# Patient Record
Sex: Male | Born: 1935 | Hispanic: Yes | Marital: Married | Smoking: Current some day smoker
Health system: Southern US, Community
[De-identification: ages and names within clinical notes are randomized; demographics above are authoritative.]

## PROBLEM LIST (undated history)

## (undated) DIAGNOSIS — I10 Essential (primary) hypertension: Secondary | ICD-10-CM

## (undated) DIAGNOSIS — E119 Type 2 diabetes mellitus without complications: Secondary | ICD-10-CM

## (undated) HISTORY — PX: EYE SURGERY: SHX253

---

## 2018-02-03 ENCOUNTER — Ambulatory Visit: Payer: Self-pay | Admitting: Podiatry

## 2018-04-12 ENCOUNTER — Encounter: Payer: Self-pay | Admitting: Physical Therapy

## 2018-04-12 ENCOUNTER — Other Ambulatory Visit: Payer: Self-pay

## 2018-04-12 ENCOUNTER — Ambulatory Visit: Payer: Medicare Other | Attending: Family Medicine | Admitting: Physical Therapy

## 2018-04-12 DIAGNOSIS — R2681 Unsteadiness on feet: Secondary | ICD-10-CM | POA: Insufficient documentation

## 2018-04-12 DIAGNOSIS — R2689 Other abnormalities of gait and mobility: Secondary | ICD-10-CM

## 2018-04-12 NOTE — Addendum Note (Signed)
Addended by: Encarnacion ChuABASHIAN, ASHLEY D on: 04/12/2018 12:37 PM   Modules accepted: Orders

## 2018-04-12 NOTE — Therapy (Signed)
[facility] MAIN [facility] [address] [place], [place], [address] Phone: [phone]   Fax:  [phone]  Physical Therapy Evaluation  Patient Details  Name: Jose Day MRN: [id number] Date of Birth: 20-Feb-1928 Referring Provider: Gladys DammeAdrain Alexander Manchero Revelo, MD   Encounter Date: 04/12/2018  PT End of Session - 04/12/18 0909    Visit Number  1    Number of Visits  13    Date for PT Re-Evaluation  05/24/18    Authorization Type  1/10 progress note    PT Start Time  0804    PT Stop Time  0912    PT Time Calculation (min)  68 min    Equipment Utilized During Treatment  Gait belt    Activity Tolerance  Patient tolerated treatment well    Behavior During Therapy  [facility] for tasks assessed/performed       History reviewed. No pertinent past medical history.  History reviewed. No pertinent surgical history.  There were no vitals filed for this visit.   Subjective Assessment - 04/12/18 1022    Subjective  Pt presents with imbalance and R knee pain    Patient is accompained by:  Family member;Interpreter Interpreter: Alis.  Daughter and wife.     Pertinent History  Pt is a 84 y/o M who reports he is having R knee pain and balance issues.  About 15-20 years ago he was playing baseball and he fell, injuring his R knee.  The pt had surgery but family unable to reports what type of surgery.  Pt denies any additional surgery either LE.  Pt walks ~15 minutes often with his wife and has been complaining about knee pain toward the end of his walk.  Pt does not use AD and denies any falls in the past 6 months.  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  Pt reports swelling in Bil feet and ankles with MD changing his medication for this a few months ago.  Pt reports some numbness in Bil feet. Pt reports he moves slowly to avoid falling.  Goals: to walk  better and farther. Current pain: 0/10, worst pain: 5/10 in R knee. Pt with memory impairments and is Spanish speaking, making history taking very challenging.  Family says MD is not aware of pt's memory issues (began 2-3 years ago), this PT encouraged family and pt to share this with MD at next visit. Pt's wife reports pt has had a decreased appetite (sometimes doesn't feel hungry) for a few months which has resulted in 2-3 lb weight loss.  Pt has been experiencing some constipation for up to 1-2 days at a time for about 1 year.  Doctor is unaware of the constipation.  Pt sleeps a lot and seems more tired which began ~2-3 months ago. Pt denies feeling down.       Limitations  Walking    How long can you sit comfortably?  n/a    How long can you walk comfortably?  15 minutes    Patient Stated Goals  to be able to walk farther and improve balance    Currently in Pain?  No/denies         [facility] PT Assessment - 04/12/18 0824      Assessment   Medical Diagnosis  Balance BLE Edema    Referring Provider  Adrain Dorothea OgleAlexander Manchero Revelo, MD    Onset Date/Surgical Date  04/13/15    Next MD Visit  In July or August 2019    Prior Therapy  No      Precautions   Precautions  Fall      Restrictions   Weight Bearing Restrictions  No      Balance Screen   Has the patient fallen in the past 6 months  No    Has the patient had a decrease in activity level because of a fear of falling?   No    Is the patient reluctant to leave their home because of a fear of falling?   No      Home Environment   Living Environment  Private residence    Living Arrangements  Spouse/significant other    Available Help at Discharge  Family;Available 24 hours/day    Type of Home  Apartment    Home Access  Stairs to enter    Entrance Stairs-Number of Steps  3    Entrance Stairs-Rails  Left;Right;Cannot reach both    Home Layout  One level    Home Equipment  None      Prior Function   Level of Independence   Independent with household mobility without device    Vocation  Retired    Leisure  Listen to music, to dance to the music, walking with wife, reading      Cognition   Overall Cognitive Status  Difficult to assess    Difficult to assess due to  Non-English speaking    Memory  Impaired family reports some memory impairments      Balance   Balance Assessed  Yes      Standardized Balance Assessment   Standardized Balance Assessment  Berg Balance Test      Berg Balance Test   Sit to Stand  Able to stand  independently using hands    Standing Unsupported  Able to stand safely 2 minutes    Sitting with Back Unsupported but Feet Supported on Floor or Stool  Able to sit safely and securely 2 minutes    Stand to Sit  Sits safely with minimal use of hands    Transfers  Able to transfer safely, minor use of hands    Standing Unsupported with Eyes Closed  Able to stand 10 seconds with supervision    Standing Ubsupported with Feet Together  Able to place feet together independently and stand 1 minute safely    From Standing, Reach Forward with Outstretched Arm  Can reach forward >12 cm safely (5")    From Standing Position, Pick up Object from Floor  Able to pick up shoe, needs supervision    From Standing Position, Turn to Look Behind Over each Shoulder  Looks behind from both sides and weight shifts well    Turn 360 Degrees  Able to turn 360 degrees safely but slowly    Standing Unsupported, Alternately Place Feet on Step/Stool  Able to stand independently and complete 8 steps >20 seconds    Standing Unsupported, One Foot in [employer] balance while stepping or standing    Standing on One Leg  Unable to try or needs assist to prevent fall    Total Score  41       EXAMINATION   5xSTS: 15.91 seconds   Berg Balance Test: 41/56   : 1.17 m/s   TUG: 16.47 seconds   ABC Scale: 61.25%   Sensation: Decreased sensation to light touch distal to Bil ankles, decreased discrimination to  dull/sharp Bil ankles and feet (L more impaired than R)   Reflexes: pt unable to relax sufficiently for proper testing despite use of Jendrassik maneuver   Posture in sitting: rounded shoulders, thoracic kyphosis, forward head posture, sacral sitting   Gait Analysis: Dec Bil hip E at terminal stance, flexed posture, dec Bil hip F   Observation: swelling Bil ankles (L>R), 1+ pitting, MD recently changed the medication due to swelling (changed in March)           Objective measurements completed on examination: See above findings.              PT Education - 04/12/18 0908    Education Details  role of PT; POC    Person(s) Educated  Patient    Methods  Explanation interpreter: Alis    Comprehension  Verbalized understanding       PT Short Term Goals - 04/12/18 0909      PT SHORT TERM GOAL #1   Title  Pt will independently complete HEP at least 4 days/wk for carryover between sessions    Time  2    Period  Weeks    Status  New        PT Long Term Goals - 04/12/18 0910      PT LONG TERM GOAL #1   Title  Pt will improve TUG time to less than or equal to 12 seconds to demonstrate improve balance and speed    Baseline  16.47 seconds    Time  6    Period  Weeks    Status  New      PT LONG TERM GOAL #2   Title  Pt's Berg Balance Test score will improve to at least 50/56 to demonstrate improved balance    Baseline  41/56    Time  6    Period  Weeks    Status  New      PT LONG TERM GOAL #3   Title  Pt's ABC Scale will improve to at least 80% to demonstrate improved perception of balance    Baseline  61.25%    Time  5    Period  Weeks    Status  New      PT LONG TERM GOAL #4   Title  Pt will be able to ambulate at least 25 minutes to be able to walk farther with his wife for exercise    Baseline  15 minutes    Time  6    Period  Weeks    Status  New             Plan - 04/12/18 1226    Clinical Impression Statement  Pt is a 84 y/o F who  presents with impaired balance with likely contributors of reports of chronic R knee pain and Bil ankle and foot swelling.  Pt's Berg Balance Test indicated pt is at an increased risk of falling and pt ambulates with a guarded posture.  Pt's ABC scale indicates that the pt has a perception of impaired balance.  Pt is limited to ambulating 15 minutes but enjoys walking with his wife and would like to go for longer walks with improved balance.  Pt will benefit from skilled PT interventions for improved balance and QOL.      History and Personal Factors relevant to plan of care:  (+) Pt very active and would like to improve his level of activity, very supportive family  (-) Chronicity of  R knee pain, Bil ankle and foot swelling, h/o diabetes with impaired sensation    Clinical Presentation  Stable    Clinical Presentation due to:  Pt's presentation is stable and predictable based on comorbities with no significant changes in presentation since onset    Clinical Decision Making  Low    Rehab Potential  Good    PT Frequency  2x / week    PT Duration  6 weeks    PT Treatment/Interventions  ADLs/Self Care Home Management;Aquatic Therapy;Biofeedback;Cryotherapy;Electrical Stimulation;Iontophoresis 4mg /ml Dexamethasone;Moist Heat;Traction;Ultrasound;Contrast Bath;DME Instruction;Gait training;Stair training;Functional mobility training;Therapeutic activities;Therapeutic exercise;Balance training;Neuromuscular re-education;Cognitive remediation;Patient/family education;Orthotic Fit/Training;Manual techniques;Compression bandaging;Passive range of motion;Dry needling;Energy conservation;Taping    PT Next Visit Plan  check semmes-weinstein, screen R knee, initiate HEP with balance and strengthening interventions    PT Home Exercise Plan  to initiate next session    Recommended Other Services  none at this time (remain mindful of depression like symptoms relayed from family, pt denies)    Consulted and Agree with  Plan of Care  Patient;Family member/caregiver    Family Member Consulted  wife and daughter       Patient will benefit from skilled therapeutic intervention in order to improve the following deficits and impairments:  Abnormal gait, Decreased balance, Decreased cognition, Decreased endurance, Decreased knowledge of use of DME, Decreased safety awareness, Decreased strength, Difficulty walking, Increased edema, Increased fascial restricitons, Increased muscle spasms, Impaired perceived functional ability, Impaired flexibility, Impaired sensation, Improper body mechanics, Postural dysfunction, Pain  Visit Diagnosis: Unsteadiness on feet  Other abnormalities of gait and mobility     Problem List There are no active problems to display for this patient.   Encarnacion Chu PT, DPT 04/12/2018, 12:33 PM  [place] [facility] MAIN [facility] SERVICES [address] [place], [place], [address] Phone: [phone]   Fax:  [phone]  Name: Jose Day MRN: [id number] Date of Birth: [birth year]

## 2018-04-19 ENCOUNTER — Ambulatory Visit: Payer: Self-pay

## 2018-04-21 ENCOUNTER — Ambulatory Visit: Payer: Medicare Other | Admitting: Physical Therapy

## 2018-04-26 ENCOUNTER — Ambulatory Visit: Payer: Self-pay

## 2018-04-27 ENCOUNTER — Ambulatory Visit: Payer: Medicare Other | Admitting: Physical Therapy

## 2018-04-29 ENCOUNTER — Ambulatory Visit: Payer: Medicare Other | Admitting: Physical Therapy

## 2018-05-04 ENCOUNTER — Ambulatory Visit: Payer: Self-pay | Admitting: Physical Therapy

## 2018-05-06 ENCOUNTER — Ambulatory Visit: Payer: Medicare Other | Admitting: Physical Therapy

## 2018-05-06 ENCOUNTER — Ambulatory Visit: Payer: Self-pay | Admitting: Physical Therapy

## 2018-05-11 ENCOUNTER — Ambulatory Visit: Payer: Self-pay | Admitting: Physical Therapy

## 2018-05-12 ENCOUNTER — Ambulatory Visit: Payer: Medicare Other | Admitting: Physical Therapy

## 2018-05-17 ENCOUNTER — Ambulatory Visit: Payer: Medicare Other | Admitting: Physical Therapy

## 2018-05-19 ENCOUNTER — Ambulatory Visit: Payer: Medicare Other | Admitting: Physical Therapy

## 2018-05-20 ENCOUNTER — Encounter: Payer: Self-pay | Admitting: Physical Therapy

## 2018-05-20 ENCOUNTER — Ambulatory Visit: Payer: Medicare Other | Attending: Family Medicine

## 2018-05-20 VITALS — BP 172/88 | HR 78

## 2018-05-20 DIAGNOSIS — R2681 Unsteadiness on feet: Secondary | ICD-10-CM | POA: Diagnosis not present

## 2018-05-20 DIAGNOSIS — R2689 Other abnormalities of gait and mobility: Secondary | ICD-10-CM | POA: Diagnosis present

## 2018-05-20 NOTE — Therapy (Signed)
[place] MAIN [facility] SERVICES [address] [place], [place], [address] Phone: [phone]   Fax:  [phone]  Physical Therapy Treatment  Patient Details  Name: Jose [name] MRN: [id number] Date of Birth: 1928-03-26 Referring Provider: Michele Rockers, MD   Encounter Date: 05/20/2018  PT End of Session - 05/24/18 1353    Visit Number  2    Number of Visits  29    Date for PT Re-Evaluation  07/15/18    Authorization Type  2/10 progress note, next visit is 1/10    PT Start Time  1515    PT Stop Time  1600    PT Time Calculation (min)  45 min    Equipment Utilized During Treatment  Gait belt    Activity Tolerance  Patient tolerated treatment well    Behavior During Therapy  [facility] for tasks assessed/performed       History reviewed. No pertinent past medical history.  History reviewed. No pertinent surgical history.  Vitals:   05/20/18 1723  BP: (!) 172/88  Pulse: 78  SpO2: 100%    Subjective Assessment - 05/24/18 1351    Subjective  Pt reports that he has been unable to come to therapy due to R foot pain due to redness and swelling. Per medical [name] pt had diabetic foot ulcers on his R foot. He denies pain when he is sitting down upon arrival today. He reports pain in standing but does not rate at this time    Patient is accompained by:  Family member;Interpreter Interpreter: Wife elects to interpret. Telephone interpeter Jose [name] is on the phone for the entire session.     Pertinent History  Pt is a 84 y/o M who reports he is having R knee pain and balance issues.  About 15-20 years ago he was playing baseball and he fell, injuring his R knee.  The pt had surgery but family unable to reports what type of surgery.  Pt denies any additional surgery either LE.  Pt walks ~15 minutes often with his wife and has been complaining about knee pain toward the end of his walk.  Pt does not use AD and denies any falls in  the past 6 months.  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  Pt reports swelling in Bil feet and ankles with MD changing his medication for this a few months ago.  Pt reports some numbness in Bil feet. Pt reports he moves slowly to avoid falling.  Goals: to walk better and farther. Current pain: 0/10, worst pain: 5/10 in R knee. Pt with memory impairments and is Spanish speaking, making history taking very challenging.  Family says MD is not aware of pt's memory issues (began 2-3 years ago), this PT encouraged family and pt to share this with MD at next visit. Pt's wife reports pt has had a decreased appetite (sometimes doesn't feel hungry) for a few months which has resulted in 2-3 lb weight loss.  Pt has been experiencing some constipation for up to 1-2 days at a time for about 1 year.  Doctor is unaware of the constipation.  Pt sleeps a lot and seems more tired which began ~2-3 months ago. Pt denies feeling down.       Limitations  Walking    How long can you sit comfortably?  n/a    How long can you walk comfortably?  15 minutes  Patient Stated Goals  to be able to walk farther and improve balance    Currently in Pain?  No/denies Pt admitted with above diagnosis. Pt currently with functional limitations due to the deficits listed below (see PT Problem List).  Pt is a pleasant 84 year old male who was admitted for SOB 05/23/18 after being brought to the ED via EMS. PMH includes C      InterpreterKittie [name] ID: [id number]   [facility] PT Assessment - 05/24/18 1355      Balance   Balance Assessed  Yes      Standardized Balance Assessment   Standardized Balance Assessment  Berg Balance Test      Berg Balance Test   Sit to Stand  Able to stand without using hands and stabilize independently    Standing Unsupported  Able to stand safely 2 minutes    Sitting with Back Unsupported but Feet Supported on Floor or Stool  Able  to sit safely and securely 2 minutes    Stand to Sit  Sits safely with minimal use of hands    Transfers  Able to transfer safely, minor use of hands    Standing Unsupported with Eyes Closed  Able to stand 10 seconds safely    Standing Ubsupported with Feet Together  Able to place feet together independently and stand 1 minute safely    From Standing, Reach Forward with Outstretched Arm  Can reach forward >12 cm safely (5")    From Standing Position, Pick up Object from Floor  Able to pick up shoe, needs supervision    From Standing Position, Turn to Look Behind Over each Shoulder  Looks behind from both sides and weight shifts well    Turn 360 Degrees  Able to turn 360 degrees safely in 4 seconds or less    Standing Unsupported, Alternately Place Feet on Step/Stool  Able to stand independently and complete 8 steps >20 seconds    Standing Unsupported, One Foot in Front  Able to plae foot ahead of the other independently and hold 30 seconds    Standing on One Leg  Tries to lift leg/unable to hold 3 seconds but remains standing independently    Total Score  49          TREATMENT  Neuromuscular Re-education  Outcome measures: Performed BERG with patient who scored 49/56; Attempted 5TSTS however pt is unable to perform well due to R foot pain during transfers so deferred. Also deferred TUG. 42mgait speed: self selected: 13s= 0.77 m/s, fastest: 8.3s = 1.2 m/s Updated goals with patient  Ther-ex  Seated marches 3# ankle weights 2 x 15; Seated clams with manual resistance 2 x 15; Seated adductor squeeze with manual resistance 2 x 15; Seated LAQ 3# ankle weights 2 x 15;                    PT Education - 05/24/18 1352    Education Details  HEP and plan of care, recertification    Person(s) Educated  Patient    Methods  Explanation    Comprehension  Verbalized understanding       PT Short Term Goals - 05/24/18 1356      PT SHORT TERM GOAL #1   Title  Pt will  independently complete HEP at least 4 days/wk for carryover between sessions    Baseline  05/24/18: inconsistent with HEP secondary to R foot pain    Time  2  Period  Weeks    Status  On-going        PT Long Term Goals - 05/24/18 1356      PT LONG TERM GOAL #1   Title  Pt will improve TUG time to less than or equal to 12 seconds to demonstrate improve balance and speed    Baseline  16.47 seconds, 05/20/18: Unable to assess due to R foot pain during transfers    Time  8    Period  Weeks    Status  Deferred    Target Date  07/15/18      PT LONG TERM GOAL #2   Title  Pt's Berg Balance Test score will improve to at least 50/56 to demonstrate improved balance    Baseline  41/56; 05/20/18: 49/56    Time  8    Period  Weeks    Status  Partially Met    Target Date  07/15/18      PT LONG TERM GOAL #3   Title  Pt's ABC Scale will improve to at least 80% to demonstrate improved perception of balance    Baseline  61.25%, 05/20/18: Deferred due to time constraints    Time  8    Period  Weeks    Status  Deferred    Target Date  07/15/18      PT LONG TERM GOAL #4   Title  Pt will be able to ambulate at least 25 minutes to be able to walk farther with his wife for exercise    Baseline  15 minutes, 05/24/18: Unchanged due to R foot pain    Time  8    Period  Weeks    Status  On-going    Target Date  07/15/18            Plan - 05/24/18 1353    Clinical Impression Statement  Pt has been unable to come to therapy since the initial evaluation due to R foot diabetic ulcer. This is still causing him pain and does somewhat limit his treatment today. However pt demonstrates excellent motivation with therapy and is able to repeat outcome measures with therapist. His gait speed is [facility] for full community mobility and his BERG balance test actually improved from the initial evaluation. Pt reports that he has been performing some balance exercises at home. Home exercise program initiated today. Pt  will continue to benefit from skilled PT services to address deficits in strength and balance.     Rehab Potential  Good    PT Frequency  2x / week    PT Duration  8 weeks    PT Treatment/Interventions  ADLs/Self Care Home Management;Aquatic Therapy;Biofeedback;Cryotherapy;Electrical Stimulation;Iontophoresis 55m/ml Dexamethasone;Moist Heat;Traction;Ultrasound;Contrast Bath;DME Instruction;Gait training;Stair training;Functional mobility training;Therapeutic activities;Therapeutic exercise;Balance training;Neuromuscular re-education;Cognitive remediation;Patient/family education;Orthotic Fit/Training;Manual techniques;Compression bandaging;Passive range of motion;Dry needling;Energy conservation;Taping    PT Next Visit Plan  continue LE strengthening and balance activities as pt is able    PT Home Exercise Plan  Seated LE strengthening exercises, sit to stand (see instructions section)    Consulted and Agree with Plan of Care  Patient;Family member/caregiver    Family Member Consulted  wife and daughter       Patient will benefit from skilled therapeutic intervention in order to improve the following deficits and impairments:  Abnormal gait, Decreased balance, Decreased cognition, Decreased endurance, Decreased knowledge of use of DME, Decreased safety awareness, Decreased strength, Difficulty walking, Increased edema, Increased fascial restricitons, Increased muscle spasms, Impaired perceived functional ability,  Impaired flexibility, Impaired sensation, Improper body mechanics, Postural dysfunction, Pain  Visit Diagnosis: Unsteadiness on feet - Plan: PT plan of care cert/re-cert  Other abnormalities of gait and mobility - Plan: PT plan of care cert/re-cert     Problem List There are no active problems to display for this patient.  Lyndel Safe Yared Barefoot PT, DPT, GCS  Minh Roanhorse 05/24/2018, 2:14 PM  [place] MAIN [facility] SERVICES [address]  [place], [place], [address] Phone: [phone]   Fax:  [phone]  Name: Jose [name] MRN: [id number] Date of Birth: 1928/07/16

## 2018-05-20 NOTE — Patient Instructions (Signed)
Access Code: [email or web address]  URL: [email or web address]  Date: 05/20/2018  Prepared by: Ria CommentJason Huprich   Exercises  Seated March - 10 reps - 2 sets - 3s hold - 1x daily - 7x weekly  Seated Hip Abduction with Resistance - 10 reps - 2 sets - 3s hold - 1x daily - 7x weekly  Seated Knee Extension with Resistance - 10 reps - 2 sets - 3s hold - 1x daily - 7x weekly  Seated Hip Adduction Isometrics with Ball - 10 reps - 2 sets - 3s hold - 1x daily - 7x weekly  Standing Romberg to 1/2 Tandem Stance - 3 sets - 30s hold - 1x daily - 7x weekly  Sit to Stand without Arm Support - 10 reps - 2 sets - 1x daily - 7x weekly

## 2018-05-24 ENCOUNTER — Ambulatory Visit: Payer: Medicare Other | Admitting: Physical Therapy

## 2018-05-26 ENCOUNTER — Ambulatory Visit: Payer: Medicare Other | Admitting: Physical Therapy

## 2018-05-27 ENCOUNTER — Ambulatory Visit: Payer: Medicare Other | Admitting: Physical Therapy

## 2018-05-27 ENCOUNTER — Encounter: Payer: Self-pay | Admitting: Physical Therapy

## 2018-05-27 DIAGNOSIS — R2681 Unsteadiness on feet: Secondary | ICD-10-CM | POA: Diagnosis not present

## 2018-05-27 DIAGNOSIS — R2689 Other abnormalities of gait and mobility: Secondary | ICD-10-CM

## 2018-05-27 NOTE — Therapy (Signed)
[place] MAIN [facility] SERVICES [address] [place], [place], [address] Phone: [phone]   Fax:  [phone]  Physical Therapy Treatment  Patient Details  Name: Jose Day MRN: [id number] Date of Birth: 21-Oct-1928 Referring Provider: Michele Rockers, MD   Encounter Date: 05/27/2018  PT End of Session - 05/27/18 1728    Visit Number  2    Number of Visits  29    Date for PT Re-Evaluation  07/15/18    Authorization Type  1/10 progress note,    PT Start Time  0525    PT Stop Time  0603    PT Time Calculation (min)  38 min    Equipment Utilized During Treatment  Gait belt    Activity Tolerance  Patient tolerated treatment well    Behavior During Therapy  [facility] for tasks assessed/performed       History reviewed. No pertinent past medical history.  History reviewed. No pertinent surgical history.  There were no vitals filed for this visit.  Subjective Assessment - 05/27/18 1735    Subjective  Patient reports 8/10 pain to right knee.     Patient is accompained by:  Family member;Interpreter Interpreter: Wife elects to interpret. Telephone interpeter [name] is on the phone for the entire session.     Pertinent History  Pt is a 84 y/o M who reports he is having R knee pain and balance issues.  About 15-20 years ago he was playing baseball and he fell, injuring his R knee.  The pt had surgery but family unable to reports what type of surgery.  Pt denies any additional surgery either LE.  Pt walks ~15 minutes often with his wife and has been complaining about knee pain toward the end of his walk.  Pt does not use AD and denies any falls in the past 6 months.  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  Pt reports swelling in Bil feet and ankles with MD changing his medication for this a few months ago.  Pt reports some numbness in Bil  feet. Pt reports he moves slowly to avoid falling.  Goals: to walk better and farther. Current pain: 0/10, worst pain: 5/10 in R knee. Pt with memory impairments and is Spanish speaking, making history taking very challenging.  Family says MD is not aware of pt's memory issues (began 2-3 years ago), this PT encouraged family and pt to share this with MD at next visit. Pt's wife reports pt has had a decreased appetite (sometimes doesn't feel hungry) for a few months which has resulted in 2-3 lb weight loss.  Pt has been experiencing some constipation for up to 1-2 days at a time for about 1 year.  Doctor is unaware of the constipation.  Pt sleeps a lot and seems more tired which began ~2-3 months ago. Pt denies feeling down.       Limitations  Walking    How long can you sit comfortably?  n/a    How long can you walk comfortably?  15 minutes    Patient Stated Goals  to be able to walk farther and improve balance    Currently in Pain?  Yes    Pain Score  8     Pain Location  Foot    Pain Orientation  Right    Pain Descriptors / Indicators  Aching    Pain Type  Chronic pain    Pain Onset  More than a month ago        neuromuscular training:  foam  and balance with head turns left and right feet apart and feet together,cues for better posture x 20   foam feet together and balance with head turns left and right feet apart and feet together,cues for better posture x 20   foam trunk rotation and balance with head turns left and right feet apart and feet together,cues for better posture x 20  Side stepping on blue balance beam left and right x 10 lengths, cues for going slowly and she needs UE support with LOB backwards step ups from floor to 6 inch stool x 20 bilateral step ups from foam  to 6 inch stool x 20 bilateral marching in parallel bars x 20, cues for bigger steps Matrix 22 . 5 lbs fwd/ bwd, side to side x 2 reps , needs UE support CGA and Min to mod verbal cues used throughout with  increased in postural sway and LOB most seen with narrow base of support and while on uneven surfaces. Continues to have balance deficits typical with diagnosis. Patient performs intermediate level exercises without pain behaviors and needs verbal cuing for postural alignment and head positioning Patient reports pain is the same during and after treatment , no change in pain                      PT Education - 05/27/18 1727    Education Details  HEP    Person(s) Educated  Patient    Methods  Explanation;Demonstration    Comprehension  Verbalized understanding;Returned demonstration       PT Short Term Goals - 05/24/18 1356      PT SHORT TERM GOAL #1   Title  Pt will independently complete HEP at least 4 days/wk for carryover between sessions    Baseline  05/24/18: inconsistent with HEP secondary to R foot pain    Time  2    Period  Weeks    Status  On-going        PT Long Term Goals - 05/24/18 1356      PT LONG TERM GOAL #1   Title  Pt will improve TUG time to less than or equal to 12 seconds to demonstrate improve balance and speed    Baseline  16.47 seconds, 05/20/18: Unable to assess due to R foot pain during transfers    Time  8    Period  Weeks    Status  Deferred    Target Date  07/15/18      PT LONG TERM GOAL #2   Title  Pt's Berg Balance Test score will improve to at least 50/56 to demonstrate improved balance    Baseline  41/56; 05/20/18: 49/56    Time  8    Period  Weeks    Status  Partially Met    Target Date  07/15/18      PT LONG TERM GOAL #3   Title  Pt's ABC Scale will improve to at least 80% to demonstrate improved perception of balance    Baseline  61.25%, 05/20/18: Deferred due to time constraints    Time  8    Period  Weeks    Status  Deferred    Target Date  07/15/18      PT LONG TERM GOAL #4   Title  Pt will be able to ambulate at  least 25 minutes to be able to walk farther with his wife for exercise    Baseline  15 minutes,  05/24/18: Unchanged due to R foot pain    Time  8    Period  Weeks    Status  On-going    Target Date  07/15/18            Plan - 05/27/18 1737    Clinical Impression Statement  Patient instructed in advanced balance and coordination exercise. He required mod VCs and min A for advanced exercise to improve weight shift and postural control. Patient requires min VCs to improve foot clearance with gait tasks and when negotiating single leg activities. Patients would benefit from additional skilled PT intervention to improve balance/gait safety and reduce fall risk;    Rehab Potential  Good    PT Frequency  2x / week    PT Duration  8 weeks    PT Treatment/Interventions  ADLs/Self Care Home Management;Aquatic Therapy;Biofeedback;Cryotherapy;Electrical Stimulation;Iontophoresis 34m/ml Dexamethasone;Moist Heat;Traction;Ultrasound;Contrast Bath;DME Instruction;Gait training;Stair training;Functional mobility training;Therapeutic activities;Therapeutic exercise;Balance training;Neuromuscular re-education;Cognitive remediation;Patient/family education;Orthotic Fit/Training;Manual techniques;Compression bandaging;Passive range of motion;Dry needling;Energy conservation;Taping    PT Next Visit Plan  continue LE strengthening and balance activities as pt is able    PT Home Exercise Plan  Seated LE strengthening exercises, sit to stand (see instructions section)    Consulted and Agree with Plan of Care  Patient;Family member/caregiver    Family Member Consulted  wife and daughter       Patient will benefit from skilled therapeutic intervention in order to improve the following deficits and impairments:  Abnormal gait, Decreased balance, Decreased cognition, Decreased endurance, Decreased knowledge of use of DME, Decreased safety awareness, Decreased strength, Difficulty walking, Increased edema, Increased fascial restricitons, Increased muscle spasms, Impaired perceived functional ability, Impaired  flexibility, Impaired sensation, Improper body mechanics, Postural dysfunction, Pain  Visit Diagnosis: Unsteadiness on feet  Other abnormalities of gait and mobility     Problem List There are no active problems to display for this patient.   [address] PVirginiaDPT 05/27/2018, 5:42 PM  [place] [facility]Donals[facility] HospitalSERVICES [address]RSavage [place] [address]Phone: [phone]  Fax:  [phone] Name: FOmauri BoeveMRN: [id number] of Birth: 2Mar 06, 1929

## 2018-05-31 ENCOUNTER — Ambulatory Visit: Payer: Medicare Other | Admitting: Physical Therapy

## 2018-06-02 ENCOUNTER — Ambulatory Visit: Payer: Medicare Other | Admitting: Physical Therapy

## 2018-06-10 ENCOUNTER — Ambulatory Visit: Payer: Medicare Other | Attending: Family Medicine | Admitting: Physical Therapy

## 2018-06-10 ENCOUNTER — Encounter: Payer: Self-pay | Admitting: Physical Therapy

## 2018-06-10 DIAGNOSIS — R2681 Unsteadiness on feet: Secondary | ICD-10-CM

## 2018-06-10 DIAGNOSIS — R2689 Other abnormalities of gait and mobility: Secondary | ICD-10-CM | POA: Diagnosis present

## 2018-06-10 NOTE — Therapy (Signed)
[place] MAIN [facility] SERVICES [address] [place], [place], [address] Phone: [phone]   Fax:  [phone]  Physical Therapy Treatment  Patient Details  Name: Jose Day MRN: [id number] Date of Birth: October 02, 1928 Referring Provider: Michele Rockers, MD   Encounter Date: 06/10/2018  PT End of Session - 06/10/18 1725    Visit Number  4    Number of Visits  29    Date for PT Re-Evaluation  07/15/18    Authorization Type  4/10 progress note,    PT Start Time  0520    PT Stop Time  0600    PT Time Calculation (min)  40 min    Equipment Utilized During Treatment  Gait belt    Activity Tolerance  Patient tolerated treatment well    Behavior During Therapy  [facility] for tasks assessed/performed       History reviewed. No pertinent past medical history.  History reviewed. No pertinent surgical history.  There were no vitals filed for this visit.  Subjective Assessment - 06/10/18 1723    Subjective  Patient is doing his HEP. He is not having any pain today.    Patient is accompained by:  Family member;Interpreter Interpreter: Jose Day elects to interpret. Telephone interpeter [name] is on the phone for the entire session.     Pertinent History  Pt is a 84 y/o M who reports he is having R knee pain and balance issues.  About 15-20 years ago he was playing baseball and he fell, injuring his R knee.  The pt had surgery but family unable to reports what type of surgery.  Pt denies any additional surgery either LE.  Pt walks ~15 minutes often with his Jose Day and has been complaining about knee pain toward the end of his walk.  Pt does not use AD and denies any falls in the past 6 months.  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  Pt reports swelling in Bil feet and ankles with MD changing his medication for this a few months ago.  Pt reports some  numbness in Bil feet. Pt reports he moves slowly to avoid falling.  Goals: to walk better and farther. Current pain: 0/10, worst pain: 5/10 in R knee. Pt with memory impairments and is Spanish speaking, making history taking very challenging.  Family says MD is not aware of pt's memory issues (began 2-3 years ago), this PT encouraged family and pt to share this with MD at next visit. Pt's Jose Day reports pt has had a decreased appetite (sometimes doesn't feel hungry) for a few months which has resulted in 2-3 lb weight loss.  Pt has been experiencing some constipation for up to 1-2 days at a time for about 1 year.  Doctor is unaware of the constipation.  Pt sleeps a lot and seems more tired which began ~2-3 months ago. Pt denies feeling down.       Limitations  Walking    How long can you sit comfortably?  n/a    How long can you walk comfortably?  15 minutes    Patient Stated Goals  to be able to walk farther and improve balance    Currently in Pain?  No/denies    Pain Score  0-No pain    Pain Onset  More than a month ago         Treatment:   Therapeutic exercise  HEP including : Marching with alternating LE's x 15 Hip abd with RTB x 15 BLE Hip ext with RTB x 15 BLE Sit to stand x 10 with tandem stand feet space Squats x 15  Side stepping in parallel bars with RTB x 10 x 2 Leg press x 20 x 2 100 lbs    CGA and Min to mod verbal cues used throughout and patient needs extra time due to repeated instructions for completing the exercises.                PT Education - 06/10/18 1724    Education Details  HEP    Person(s) Educated  Patient    Methods  Explanation    Comprehension  Verbalized understanding       PT Short Term Goals - 05/24/18 1356      PT SHORT TERM GOAL #1   Title  Pt will independently complete HEP at least 4 days/wk for carryover between sessions    Baseline  05/24/18: inconsistent with HEP secondary to R foot pain    Time  2    Period  Weeks     Status  On-going        PT Long Term Goals - 05/24/18 1356      PT LONG TERM GOAL #1   Title  Pt will improve TUG time to less than or equal to 12 seconds to demonstrate improve balance and speed    Baseline  16.47 seconds, 05/20/18: Unable to assess due to R foot pain during transfers    Time  8    Period  Weeks    Status  Deferred    Target Date  07/15/18      PT LONG TERM GOAL #2   Title  Pt's Berg Balance Test score will improve to at least 50/56 to demonstrate improved balance    Baseline  41/56; 05/20/18: 49/56    Time  8    Period  Weeks    Status  Partially Met    Target Date  07/15/18      PT LONG TERM GOAL #3   Title  Pt's ABC Scale will improve to at least 80% to demonstrate improved perception of balance    Baseline  61.25%, 05/20/18: Deferred due to time constraints    Time  8    Period  Weeks    Status  Deferred    Target Date  07/15/18      PT LONG TERM GOAL #4   Title  Pt will be able to ambulate at least 25 minutes to be able to walk farther with his Jose Day for exercise    Baseline  15 minutes, 05/24/18: Unchanged due to R foot pain    Time  8    Period  Weeks    Status  On-going    Target Date  07/15/18            Plan - 06/10/18 1726    Clinical Impression Statement  Patient was asked to show PT his HEP and he began to jump in a fwd position and walk doing lunges, both are not exercises that he was educated in. The patient was educated in a HEP with the assistance of his daughter due to his language deficit. He needed  repeated cues to perform the exerciises in the correct  form and technique. He will continue to benefit from skilled PT to improve his mobility and strength and quality of life.  Rehab Potential  Good    PT Frequency  2x / week    PT Duration  8 weeks    PT Treatment/Interventions  ADLs/Self Care Home Management;Aquatic Therapy;Biofeedback;Cryotherapy;Electrical Stimulation;Iontophoresis 37m/ml Dexamethasone;Moist  Heat;Traction;Ultrasound;Contrast Bath;DME Instruction;Gait training;Stair training;Functional mobility training;Therapeutic activities;Therapeutic exercise;Balance training;Neuromuscular re-education;Cognitive remediation;Patient/family education;Orthotic Fit/Training;Manual techniques;Compression bandaging;Passive range of motion;Dry needling;Energy conservation;Taping    PT Next Visit Plan  continue LE strengthening and balance activities as pt is able    PT Home Exercise Plan  Seated LE strengthening exercises, sit to stand (see instructions section)    Consulted and Agree with Plan of Care  Patient;Family member/caregiver    Family Member Consulted  Jose Day and daughter       Patient will benefit from skilled therapeutic intervention in order to improve the following deficits and impairments:  Abnormal gait, Decreased balance, Decreased cognition, Decreased endurance, Decreased knowledge of use of DME, Decreased safety awareness, Decreased strength, Difficulty walking, Increased edema, Increased fascial restricitons, Increased muscle spasms, Impaired perceived functional ability, Impaired flexibility, Impaired sensation, Improper body mechanics, Postural dysfunction, Pain  Visit Diagnosis: Unsteadiness on feet  Other abnormalities of gait and mobility     Problem List There are no active problems to display for this patient.   [address] PV[place] DPT 06/10/2018, 5:49 PM  [place]MAIN RShore Rehabilitation InstituteSERVICES [address] [address]Phone: [phone]  Fax:  [phone] Name: FDanon LograssoMRN: [id number] of Birth: [birth year]

## 2018-06-10 NOTE — Patient Instructions (Signed)
SIT TO STAND: Feet in Modified Tandem    Place one foot in front of the other. Lean chest forward. Raise hips and straighten knees to stand. _15__ reps per set, _2_ sets per day, 7___ days per week Hold onto a support. Repeat with other leg.  Copyright  VHI. All rights reserved.  High Stepping    Using support, lift knees, taking high steps. Repeat ___15_ times. Do __2__ sessions per day.  http://gt2.exer.us/533   Copyright  VHI. All rights reserved.  Band Walk: Side Stepping    Tie band around legs, just above knees. Step _5__ feet to one side, then step back to start. Repeat _2__ feet per session. Note: Small towel between band and skin eases rubbing.  http://plyo.exer.us/76   Copyright  VHI. All rights reserved.  Heel Raises    Stand with support. Tighten pelvic floor and hold. With knees straight, raise heels off ground. Hold Repeat _15__ times. Do _2__ times a day.  Copyright  VHI. All rights reserved.  Mini-Squats (Standing)    Stand with support. Bend knees slightly. Tighten pelvic floor. Hold for __3_ seconds. Return to straight standing.  Repeat 15___ times. Do ___2 times a day.  Copyright  VHI. All rights reserved.  HIP / KNEE: Extension - Standing (Band)    Place band around leg. Raise and lift leg backward. Keep knee straight. . Use band. __15_ reps per set, __2_ sets per day, _7__ days per week Hold onto a support.  Copyright  VHI. All rights reserved.  HIP: Abduction - Standing (Band)    Place band around legs. . Raise leg out and slightly back.  Use band. __15_ reps per set, 2___ sets per day, 7___ days per week Hold onto a support.  Copyright  VHI. All rights reserved.

## 2018-06-11 ENCOUNTER — Other Ambulatory Visit: Payer: Self-pay | Admitting: Family Medicine

## 2018-06-11 DIAGNOSIS — Z13820 Encounter for screening for osteoporosis: Secondary | ICD-10-CM

## 2018-06-16 ENCOUNTER — Ambulatory Visit: Payer: Medicare Other | Admitting: Physical Therapy

## 2018-06-17 ENCOUNTER — Ambulatory Visit: Payer: Medicare Other | Admitting: Physical Therapy

## 2018-06-17 ENCOUNTER — Encounter: Payer: Self-pay | Admitting: Physical Therapy

## 2018-06-17 DIAGNOSIS — R2681 Unsteadiness on feet: Secondary | ICD-10-CM | POA: Diagnosis not present

## 2018-06-17 DIAGNOSIS — R2689 Other abnormalities of gait and mobility: Secondary | ICD-10-CM

## 2018-06-17 NOTE — Therapy (Addendum)
[place] MAIN [facility] SERVICES [address] [place], [place], [address] Phone: [phone]   Fax:  [phone]  Physical Therapy Treatment  Patient Details  Name: Jose Day MRN: [id number] Date of Birth: 1927-12-13 Referring Provider: Michele Rockers, MD   Encounter Date: 06/17/2018  PT End of Session - 06/17/18 1108    Visit Number  5    Number of Visits  29    Date for PT Re-Evaluation  07/15/18    Authorization Type  5/10 progress note,    PT Start Time  1100    PT Stop Time  1145    PT Time Calculation (min)  45 min    Equipment Utilized During Treatment  Gait belt    Activity Tolerance  Patient tolerated treatment well    Behavior During Therapy  [facility] for tasks assessed/performed       History reviewed. No pertinent past medical history.  History reviewed. No pertinent surgical history.  There were no vitals filed for this visit.  Subjective Assessment - 06/17/18 1107    Subjective  Patient is doing well today; not having any pain. Pt reports doing his HEP exercises given at last session.    Patient is accompained by:  Family member;Interpreter   Interpreter: Wife elects to interpret. Telephone interpeter [name] is on the phone for the entire session.    Pertinent History  Pt is a 84 y/o M who reports he is having R knee pain and balance issues.  About 15-20 years ago he was playing baseball and he fell, injuring his R knee.  The pt had surgery but family unable to reports what type of surgery.  Pt denies any additional surgery either LE.  Pt walks ~15 minutes often with his wife and has been complaining about knee pain toward the end of his walk.  Pt does not use AD and denies any falls in the past 6 months.  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  Pt reports swelling in Bil feet and ankles with MD changing his  medication for this a few months ago.  Pt reports some numbness in Bil feet. Pt reports he moves slowly to avoid falling.  Goals: to walk better and farther. Current pain: 0/10, worst pain: 5/10 in R knee. Pt with memory impairments and is Spanish speaking, making history taking very challenging.  Family says MD is not aware of pt's memory issues (began 2-3 years ago), this PT encouraged family and pt to share this with MD at next visit. Pt's wife reports pt has had a decreased appetite (sometimes doesn't feel hungry) for a few months which has resulted in 2-3 lb weight loss.  Pt has been experiencing some constipation for up to 1-2 days at a time for about 1 year.  Doctor is unaware of the constipation.  Pt sleeps a lot and seems more tired which began ~2-3 months ago. Pt denies feeling down.       Limitations  Walking    How long can you sit comfortably?  n/a    How long can you walk comfortably?  15 minutes    Patient Stated Goals  to be able to walk farther and improve balance    Currently in Pain?  No/denies    Pain Onset  More than a month ago        Treatment Warm up on Nustep level 2  x5 min (unbilled)  Leg press, BLE, 2x20 90 lbs, VCs for controlling descent of weights for better motor control   Resisted walking, forwards/backwards/side-to-side x2 laps each direction, CGA-min A for safety with VCs for controlling on the way back  Marching with alternating LE's x 15 Hip abd with RTB x 15 BLE Hip ext with RTB x 15 BLE  CGA and min to mod verbal cues used throughout and patient needs extra time due to repeated instructions for completing the exercises.   Diona Foley tosses with daughter standing on airex pad in // bars x2 mins, CGA-min A for safety for minor LOB without UE support; daughter asked to vary placement of tosses and pt demonstrated ability to shift weight and catch and toss the ball appropriately, VCs for using ankles and hips to maintain  balance                    PT Education - 06/17/18 1108    Education Details  HEP, exercise technique    Person(s) Educated  Patient    Methods  Explanation;Demonstration;Verbal cues    Comprehension  Verbalized understanding;Returned demonstration;Verbal cues required;Need further instruction       PT Short Term Goals - 05/24/18 1356      PT SHORT TERM GOAL #1   Title  Pt will independently complete HEP at least 4 days/wk for carryover between sessions    Baseline  05/24/18: inconsistent with HEP secondary to R foot pain    Time  2    Period  Weeks    Status  On-going        PT Long Term Goals - 05/24/18 1356      PT LONG TERM GOAL #1   Title  Pt will improve TUG time to less than or equal to 12 seconds to demonstrate improve balance and speed    Baseline  16.47 seconds, 05/20/18: Unable to assess due to R foot pain during transfers    Time  8    Period  Weeks    Status  Deferred    Target Date  07/15/18      PT LONG TERM GOAL #2   Title  Pt's Berg Balance Test score will improve to at least 50/56 to demonstrate improved balance    Baseline  41/56; 05/20/18: 49/56    Time  8    Period  Weeks    Status  Partially Met    Target Date  07/15/18      PT LONG TERM GOAL #3   Title  Pt's ABC Scale will improve to at least 80% to demonstrate improved perception of balance    Baseline  61.25%, 05/20/18: Deferred due to time constraints    Time  8    Period  Weeks    Status  Deferred    Target Date  07/15/18      PT LONG TERM GOAL #4   Title  Pt will be able to ambulate at least 25 minutes to be able to walk farther with his wife for exercise    Baseline  15 minutes, 05/24/18: Unchanged due to R foot pain    Time  8    Period  Weeks    Status  On-going    Target Date  07/15/18            Plan - 06/17/18 1248    Clinical Impression Statement  Pt tolerated therapy session well. Pt was unable to remember HEP exercises but  wife was able to articulate  which exercises were painful for the patient at home. Pt performed leg press and resisted walking without any complaints of pain or fatigue; verbalized remembering doing these exercises previously. Pt required CGA-min A for resisted walking with VCs for controlling return to the matchine. Pt performed standing strengthening and balance exercises with supervision-CGA for safety with mod VCs for proper technique and sequencing of exercises. He will continue to benefit from skilled PT to improve his balance, strength, gait safety, and overall mobility.     Rehab Potential  Good    PT Frequency  2x / week    PT Duration  8 weeks    PT Treatment/Interventions  ADLs/Self Care Home Management;Aquatic Therapy;Biofeedback;Cryotherapy;Electrical Stimulation;Iontophoresis 107m/ml Dexamethasone;Moist Heat;Traction;Ultrasound;Contrast Bath;DME Instruction;Gait training;Stair training;Functional mobility training;Therapeutic activities;Therapeutic exercise;Balance training;Neuromuscular re-education;Cognitive remediation;Patient/family education;Orthotic Fit/Training;Manual techniques;Compression bandaging;Passive range of motion;Dry needling;Energy conservation;Taping    PT Next Visit Plan  continue LE strengthening and balance activities as pt is able    PT Home Exercise Plan  Seated LE strengthening exercises, sit to stand (see instructions section)    Consulted and Agree with Plan of Care  Patient;Family member/caregiver    Family Member Consulted  wife and daughter       Patient will benefit from skilled therapeutic intervention in order to improve the following deficits and impairments:  Abnormal gait, Decreased balance, Decreased cognition, Decreased endurance, Decreased knowledge of use of DME, Decreased safety awareness, Decreased strength, Difficulty walking, Increased edema, Increased fascial restricitons, Increased muscle spasms, Impaired perceived functional ability, Impaired flexibility, Impaired  sensation, Improper body mechanics, Postural dysfunction, Pain  Visit Diagnosis: Unsteadiness on feet  Other abnormalities of gait and mobility     Problem List There are no active problems to display for this patient.  KHarriet Masson SPT This entire session was performed under direct supervision and direction of a licensed therapist/therapist assistant . I have personally read, edited and approve of the note as written.  Trotter,Margaret PT, DPT 06/17/2018, 4:10 PM  [place] [facility] [address] [address]Phone: [phone]  Fax:  [phone] Name: FDenzel EtienneMRN: [id number] of Birth: 2February 07, 1929

## 2018-06-24 ENCOUNTER — Ambulatory Visit: Payer: Medicare Other | Admitting: Physical Therapy

## 2018-06-30 ENCOUNTER — Encounter: Payer: Self-pay | Admitting: Physical Therapy

## 2018-06-30 ENCOUNTER — Ambulatory Visit: Payer: Medicare Other | Admitting: Physical Therapy

## 2018-06-30 DIAGNOSIS — R2689 Other abnormalities of gait and mobility: Secondary | ICD-10-CM

## 2018-06-30 DIAGNOSIS — R2681 Unsteadiness on feet: Secondary | ICD-10-CM

## 2018-07-01 NOTE — Therapy (Signed)
[place] MAIN [facility] SERVICES [address] [place], [place], [address] Phone: [phone]   Fax:  [phone]  July 01, 2018   '@CCLISTADDRESS'$ @  Physical Therapy Discharge Summary  Patient: Jose Day  MRN: [id number]  Date of Birth: Oct 20, 1928   Diagnosis: Unsteadiness on feet  Other abnormalities of gait and mobility Referring Provider: Michele Rockers, MD  Patient arrived today for a treatment session. However his right foot is still hurting 8/10 with all weight bearing tasks. He has swelling and a wound on the distal 2nd toe. He has not started antibiotics at this time, but is hoping to start tonight. Patient has uncontrolled diabetes and with the presentation of chronic toe pain/wound, PT recommended that therapy stop at this time until his foot has healed. Recommended patient follow up with his podiatrist. Patient agreeable. Instructed patient that when his foot has healed and its not hurting then we can do a new evaluation for PT if he desires.   The above patient had been seen in Physical Therapy 5 times of 10 treatments scheduled with 0 no shows and 5 cancellations.  The treatment consisted of strengthening and HEP; patient has an extensive HEP including seated exercise to reduce strain on foot;  The patient is: Unchanged  Subjective: "my foot hurts every time I stand."   Discharge Findings: no objective tests done due to increased foot pain and decreased tolerance with weight bearing;    No Goals Met    Sincerely,   Darria Corvera, PT, DPT      [place] [address] [place], [place], [address] Phone: [phone]   Fax:  [phone]  Patient: Jose Day  MRN: [id number]  Date of Birth: June 12, 1928

## 2018-07-08 ENCOUNTER — Ambulatory Visit: Payer: Medicare Other | Admitting: Physical Therapy

## 2018-07-27 ENCOUNTER — Ambulatory Visit
Admission: RE | Admit: 2018-07-27 | Discharge: 2018-07-27 | Disposition: A | Discharge status: Home / Self Care | Payer: Medicare Other | Source: Ambulatory Visit | Attending: Family Medicine | Admitting: Family Medicine

## 2018-07-27 DIAGNOSIS — Z13820 Encounter for screening for osteoporosis: Secondary | ICD-10-CM | POA: Diagnosis present

## 2018-07-27 DIAGNOSIS — M810 Age-related osteoporosis without current pathological fracture: Secondary | ICD-10-CM | POA: Diagnosis not present

## 2018-07-27 DIAGNOSIS — M8588 Other specified disorders of bone density and structure, other site: Secondary | ICD-10-CM | POA: Insufficient documentation

## 2018-11-29 ENCOUNTER — Ambulatory Visit
Admission: RE | Admit: 2018-11-29 | Discharge: 2018-11-29 | Disposition: A | Discharge status: Home / Self Care | Payer: Medicare Other | Source: Ambulatory Visit | Attending: Physician Assistant | Admitting: Physician Assistant

## 2018-11-29 ENCOUNTER — Encounter: Payer: Medicare Other | Attending: Physician Assistant | Admitting: Physician Assistant

## 2018-11-29 ENCOUNTER — Ambulatory Visit
Admission: RE | Admit: 2018-11-29 | Discharge: 2018-11-29 | Disposition: A | Discharge status: Home / Self Care | Payer: Medicare Other | Attending: Physician Assistant | Admitting: Physician Assistant

## 2018-11-29 ENCOUNTER — Other Ambulatory Visit: Payer: Self-pay | Admitting: Physician Assistant

## 2018-11-29 DIAGNOSIS — M1990 Unspecified osteoarthritis, unspecified site: Secondary | ICD-10-CM | POA: Insufficient documentation

## 2018-11-29 DIAGNOSIS — I10 Essential (primary) hypertension: Secondary | ICD-10-CM | POA: Insufficient documentation

## 2018-11-29 DIAGNOSIS — Z87891 Personal history of nicotine dependence: Secondary | ICD-10-CM | POA: Insufficient documentation

## 2018-11-29 DIAGNOSIS — E11622 Type 2 diabetes mellitus with other skin ulcer: Secondary | ICD-10-CM | POA: Insufficient documentation

## 2018-11-29 DIAGNOSIS — Z881 Allergy status to other antibiotic agents status: Secondary | ICD-10-CM | POA: Diagnosis not present

## 2018-11-29 DIAGNOSIS — S81801A Unspecified open wound, right lower leg, initial encounter: Secondary | ICD-10-CM | POA: Insufficient documentation

## 2018-11-29 DIAGNOSIS — L97512 Non-pressure chronic ulcer of other part of right foot with fat layer exposed: Secondary | ICD-10-CM | POA: Diagnosis present

## 2018-11-29 NOTE — Progress Notes (Signed)
IZSAK, HARTUNIAN ([id number]) Visit Report for 11/29/2018 Abuse/Suicide Risk Screen Details Patient Name: Jose Day, Jose Day Date of Service: 11/29/2018 8:45 AM Medical Record Number: [id number] Patient Account Number: [id number] Date of Birth/Sex: Apr 26, 1928 (85 y.o. M) Treating RN: Curtis Sites Primary Care Rajah Lamba: Magdalene Patricia Other Clinician: Referring Jazyah Butsch: Magdalene Patricia Treating Bao Coreas/Extender: STONE III, HOYT Weeks in Treatment: 0 Abuse/Suicide Risk Screen Items Answer ABUSE/SUICIDE RISK SCREEN: Has anyone close to you tried to hurt or harm you recentlyo No Do you feel uncomfortable with anyone in your familyo No Has anyone forced you do things that you didnot want to doo No Do you have any thoughts of harming yourselfo No Patient displays signs or symptoms of abuse and/or neglect. No Electronic Signature(s) Signed: 11/29/2018 3:56:47 PM By: Curtis Sites Entered By: Curtis Sites on 11/29/2018 09:04:05 Jose Day ([id number]) -------------------------------------------------------------------------------- Activities of Daily Living Details Patient Name: Jose Day Date of Service: 11/29/2018 8:45 AM Medical Record Number: [id number] Patient Account Number: [id number] Date of Birth/Sex: 04-16-1928 (85 y.o. M) Treating RN: Curtis Sites Primary Care Alyxander Kollmann: Magdalene Patricia Other Clinician: Referring Marquelle Balow: Magdalene Patricia Treating Nashea Chumney/Extender: STONE III, HOYT Weeks in Treatment: 0 Activities of Daily Living Items Answer Activities of Daily Living (Please select one for each item) Drive Automobile Not Able Take Medications Completely Able Use Telephone Completely Able Care for Appearance Completely Able Use Toilet Completely Able Bath / Shower Completely Able Dress Self Completely Able Feed Self Completely Able Walk Completely Able Get In / Out Bed Completely Able Housework Completely  Able Prepare Meals Completely Able Handle Money Completely Able Shop for Self Completely Able Electronic Signature(s) Signed: 11/29/2018 3:56:47 PM By: Curtis Sites Entered By: Curtis Sites on 11/29/2018 09:04:32 Jose Day ([id number]) -------------------------------------------------------------------------------- Education Assessment Details Patient Name: Jose Day Date of Service: 11/29/2018 8:45 AM Medical Record Number: [id number] Patient Account Number: [id number] Date of Birth/Sex: Nov 09, 1928 (85 y.o. M) Treating RN: Curtis Sites Primary Care Britainy Kozub: Magdalene Patricia Other Clinician: Referring Larya Charpentier: Magdalene Patricia Treating Aamya Orellana/Extender: Linwood Dibbles, HOYT Weeks in Treatment: 0 Primary Learner Assessed: Patient Learning Preferences/Education Level/Primary Language Learning Preference: Explanation, Demonstration Highest Education Level: High School Preferred Language: Spanish Cognitive Barrier Assessment/Beliefs Language Barrier: Yes Translator Needed: Yes Memory Deficit: No Emotional Barrier: No Cultural/Religious Beliefs Affecting Medical Care: No Physical Barrier Assessment Impaired Vision: No Impaired Hearing: No Decreased Hand dexterity: No Knowledge/Comprehension Assessment Knowledge Level: Medium Comprehension Level: Medium Ability to understand written Medium instructions: Ability to understand verbal Medium instructions: Motivation Assessment Anxiety Level: Calm Cooperation: Cooperative Education Importance: Acknowledges Need Interest in Health Problems: Asks Questions Perception: Coherent Willingness to Engage in Self- Medium Management Activities: Readiness to Engage in Self- Medium Management Activities: Electronic Signature(s) Signed: 11/29/2018 3:56:47 PM By: Curtis Sites Entered By: Curtis Sites on 11/29/2018 09:05:09 Jose Day  ([id number]) -------------------------------------------------------------------------------- Fall Risk Assessment Details Patient Name: Jose Day Date of Service: 11/29/2018 8:45 AM Medical Record Number: [id number] Patient Account Number: [id number] Date of Birth/Sex: Oct 14, 1928 (85 y.o. M) Treating RN: Curtis Sites Primary Care Baptiste Littler: Magdalene Patricia Other Clinician: Referring Johnny Latu: Magdalene Patricia Treating Santresa Levett/Extender: Linwood Dibbles, HOYT Weeks in Treatment: 0 Fall Risk Assessment Items Have you had 2 or more falls in the last 12 monthso 0 No Have you had any fall that resulted in injury in the last 12 monthso 0 No FALL RISK ASSESSMENT: History of falling - immediate or within 3 months 25 Yes Secondary diagnosis 0 No Ambulatory aid None/bed rest/wheelchair/nurse 0 No Crutches/cane/walker 0 No Furniture 0  No IV Access/Saline Lock 0 No Gait/Training Normal/bed rest/immobile 0 No Weak 10 Yes Impaired 20 Yes Mental Status Oriented to own ability 0 Yes Electronic Signature(s) Signed: 11/29/2018 3:56:47 PM By: Curtis Sites Entered By: Curtis Sites on 11/29/2018 09:06:06 Jose Day ([id number]) -------------------------------------------------------------------------------- Foot Assessment Details Patient Name: Jose Day Date of Service: 11/29/2018 8:45 AM Medical Record Number: [id number] Patient Account Number: [id number] Date of Birth/Sex: [birth year] (85 y.o. M) Treating RN: Curtis Sites Primary Care Ryder Man: Magdalene Patricia Other Clinician: Referring Juquan Reznick: Magdalene Patricia Treating Nari Vannatter/Extender: STONE III, HOYT Weeks in Treatment: 0 Foot Assessment Items Site Locations + = Sensation present, - = Sensation absent, C = Callus, U = Ulcer R = Redness, W = Warmth, M = Maceration, PU = Pre-ulcerative lesion F = Fissure, S = Swelling, D = Dryness Assessment Right: Left: Other Deformity: No No Prior Foot  Ulcer: No No Prior Amputation: No No Charcot Joint: No No Ambulatory Status: Ambulatory Without Help Gait: Unsteady Electronic Signature(s) Signed: 11/29/2018 3:56:47 PM By: Curtis Sites Entered By: Curtis Sites on 11/29/2018 09:19:24 Jose Day ([id number]) -------------------------------------------------------------------------------- Nutrition Risk Assessment Details Patient Name: Jose Day Date of Service: 11/29/2018 8:45 AM Medical Record Number: [id number] Patient Account Number: [id number] Date of Birth/Sex: December 26, 1927 (85 y.o. M) Treating RN: Curtis Sites Primary Care Khanh Cordner: Magdalene Patricia Other Clinician: Referring Felis Quillin: Magdalene Patricia Treating Emon Miggins/Extender: STONE III, HOYT Weeks in Treatment: 0 Height (in): 60 Weight (lbs): 131.9 Body Mass Index (BMI): 25.8 Nutrition Risk Assessment Items NUTRITION RISK SCREEN: I have an illness or condition that made me change the kind and/or amount of 0 No food I eat I eat fewer than two meals per day 0 No I eat few fruits and vegetables, or milk products 0 No I have three or more drinks of beer, liquor or wine almost every day 0 No I have tooth or mouth problems that make it hard for me to eat 0 No I don't always have enough money to buy the food I need 0 No I eat alone most of the time 0 No I take three or more different prescribed or over-the-counter drugs a day 1 Yes Without wanting to, I have lost or gained 10 pounds in the last six months 0 No I am not always physically able to shop, cook and/or feed myself 0 No Nutrition Protocols Good Risk Protocol 0 No interventions needed Moderate Risk Protocol Electronic Signature(s) Signed: 11/29/2018 3:56:47 PM By: Curtis Sites Entered By: Curtis Sites on 11/29/2018 09:06:16

## 2018-11-30 NOTE — Progress Notes (Signed)
Jose Day, Umberto ([id number]) Visit Report for 11/29/2018 Allergy List Details Patient Name: Jose Day, Khizar Date of Service: 11/29/2018 8:45 AM Medical Record Number: [id number] Patient Account Number: [id number] Date of Birth/Sex: [birth year] (85 y.o. M) Treating RN: Curtis Sitesorthy, Joanna Primary Care Aileen Amore: Magdalene PatriciaEVELO, ADRIAN Other Clinician: Referring Franz Svec: Magdalene PatriciaEVELO, ADRIAN Treating Alicja Everitt/Extender: STONE III, HOYT Weeks in Treatment: 0 Allergies Active Allergies No Known Drug Allergies Allergy Notes Electronic Signature(s) Signed: 11/29/2018 3:56:47 PM By: Curtis Sitesorthy, Joanna Entered By: Curtis Sitesorthy, Joanna on 11/29/2018 09:03:10 Jose RoODRIGUEZ Day, Jereme ([id number]) -------------------------------------------------------------------------------- Arrival Information Details Patient Name: Jose RoODRIGUEZ Day, Romin Date of Service: 11/29/2018 8:45 AM Medical Record Number: [id number] Patient Account Number: [id number] Date of Birth/Sex: [birth year] (85 y.o. M) Treating RN: Arnette NorrisBiell, Kristina Primary Care Thayer Inabinet: Magdalene PatriciaEVELO, ADRIAN Other Clinician: Referring Anjelita Sheahan: Magdalene PatriciaEVELO, ADRIAN Treating Shavell Nored/Extender: Linwood DibblesSTONE III, HOYT Weeks in Treatment: 0 Visit Information Patient Arrived: Ambulatory Arrival Time: 08:54 Accompanied By: daughter Transfer Assistance: None Patient Identification Verified: Yes Secondary Verification Process Completed: Yes Electronic Signature(s) Signed: 11/29/2018 3:56:35 PM By: Dayton MartesWallace, RCP,RRT,CHT, Sallie RCP, RRT, CHT Entered By: Dayton MartesWallace, RCP,RRT,CHT, Sallie on 11/29/2018 08:54:55 RODRIGUEZ Dwana MelenaNEGRON, Quran ([id number]) -------------------------------------------------------------------------------- Clinic Level of Care Assessment Details Patient Name: Jose RoODRIGUEZ Day, Quaron Date of Service: 11/29/2018 8:45 AM Medical Record Number: [id number] Patient Account Number: [id number] Date of Birth/Sex: [birth year] (85 y.o. M) Treating RN: Arnette NorrisBiell,  Kristina Primary Care Karizma Cheek: Magdalene PatriciaEVELO, ADRIAN Other Clinician: Referring Ronika Kelson: Magdalene PatriciaEVELO, ADRIAN Treating Etana Beets/Extender: STONE III, HOYT Weeks in Treatment: 0 Clinic Level of Care Assessment Items TOOL 1 Quantity Score []  - Use when EandM and Procedure is performed on INITIAL visit 0 ASSESSMENTS - Nursing Assessment / Reassessment X - General Physical Exam (combine w/ comprehensive assessment (listed just below) when 1 20 performed on new pt. evals) X- 1 25 Comprehensive Assessment (HX, ROS, Risk Assessments, Wounds Hx, etc.) ASSESSMENTS - Wound and Skin Assessment / Reassessment []  - Dermatologic / Skin Assessment (not related to wound area) 0 ASSESSMENTS - Ostomy and/or Continence Assessment and Care []  - Incontinence Assessment and Management 0 []  - 0 Ostomy Care Assessment and Management (repouching, etc.) PROCESS - Coordination of Care X - Simple Patient / Family Education for ongoing care 1 15 []  - 0 Complex (extensive) Patient / Family Education for ongoing care X- 1 10 Staff obtains ChiropractorConsents, Records, Test Results / Process Orders X- 1 10 Staff telephones HHA, Nursing Homes / Clarify orders / etc []  - 0 Routine Transfer to another Facility (non-emergent condition) []  - 0 Routine Hospital Admission (non-emergent condition) X- 1 15 New Admissions / Manufacturing engineernsurance Authorizations / Ordering NPWT, Apligraf, etc. []  - 0 Emergency Hospital Admission (emergent condition) PROCESS - Special Needs []  - Pediatric / Minor Patient Management 0 []  - 0 Isolation Patient Management []  - 0 Hearing / Language / Visual special needs []  - 0 Assessment of Community assistance (transportation, D/C planning, etc.) []  - 0 Additional assistance / Altered mentation []  - 0 Support Surface(s) Assessment (bed, cushion, seat, etc.) RODRIGUEZ Day, Finnis ([id number]) INTERVENTIONS - Miscellaneous []  - External ear exam 0 []  - 0 Patient Transfer (multiple staff / Nurse, adultHoyer Lift / Similar  devices) []  - 0 Simple Staple / Suture removal (25 or less) []  - 0 Complex Staple / Suture removal (26 or more) []  - 0 Hypo/Hyperglycemic Management (do not check if billed separately) X- 1 15 Ankle / Brachial Index (ABI) - do not check if billed separately Has the patient been seen at the hospital within the last three years: Yes Total Score: 110 Level Of  Care: New/Established - Level 3 Electronic Signature(s) Signed: 11/29/2018 5:13:49 PM By: Arnette Norris Entered By: Arnette Norris on 11/29/2018 09:56:03 Jose Ro ([id number]) -------------------------------------------------------------------------------- Encounter Discharge Information Details Patient Name: Jose Ro Date of Service: 11/29/2018 8:45 AM Medical Record Number: [id number] Patient Account Number: [id number] Date of Birth/Sex: 06/24/1928 (85 y.o. M) Treating RN: Curtis Sites Primary Care Aishani Kalis: Magdalene Patricia Other Clinician: Referring Annayah Worthley: Magdalene Patricia Treating Annagrace Carr/Extender: Linwood Dibbles, HOYT Weeks in Treatment: 0 Encounter Discharge Information Items Post Procedure Vitals Discharge Condition: Stable Temperature (F): 97.6 Ambulatory Status: Ambulatory Pulse (bpm): 46 Discharge Destination: Home Respiratory Rate (breaths/min): 16 Transportation: Private Auto Blood Pressure (mmHg): 148/95 Accompanied By: dtr Schedule Follow-up Appointment: Yes Clinical Summary of Care: Electronic Signature(s) Signed: 11/29/2018 3:48:46 PM By: Curtis Sites Entered By: Curtis Sites on 11/29/2018 15:48:45 Jose Ro ([id number]) -------------------------------------------------------------------------------- Lower Extremity Assessment Details Patient Name: Jose Ro Date of Service: 11/29/2018 8:45 AM Medical Record Number: [id number] Patient Account Number: [id number] Date of Birth/Sex: [birth year] (85 y.o. M) Treating RN: Curtis Sites Primary Care Briana Newman: Magdalene Patricia Other Clinician: Referring Yetunde Leis: Magdalene Patricia Treating Elmond Poehlman/Extender: STONE III, HOYT Weeks in Treatment: 0 Edema Assessment Assessed: [Left: No] [Right: No] Edema: [Left: No] [Right: No] Vascular Assessment Pulses: Dorsalis Pedis Palpable: [Left:Yes] [Right:Yes] Doppler Audible: [Left:Yes] [Right:Yes] Posterior Tibial Palpable: [Left:Yes] [Right:Yes] Doppler Audible: [Left:Yes] [Right:Yes] Extremity colors, hair growth, and conditions: Extremity Color: [Left:Normal] [Right:Normal] Hair Growth on Extremity: [Left:No] [Right:No] Temperature of Extremity: [Left:Warm] [Right:Warm] Capillary Refill: [Left:< 3 seconds] [Right:< 3 seconds] Toe Nail Assessment Left: Right: Thick: Yes Yes Discolored: Yes Yes Deformed: No No Improper Length and Hygiene: No No Notes ABI [place] BILATERAL >220 Electronic Signature(s) Signed: 11/29/2018 3:56:47 PM By: Curtis Sites Entered By: Curtis Sites on 11/29/2018 09:17:41 RODRIGUEZ [name] ([id number]) -------------------------------------------------------------------------------- Multi Wound Chart Details Patient Name: Jose Ro Date of Service: 11/29/2018 8:45 AM Medical Record Number: [id number] Patient Account Number: [id number] Date of Birth/Sex: 1928-03-04 (85 y.o. M) Treating RN: Arnette Norris Primary Care Jamil Castillo: Magdalene Patricia Other Clinician: Referring Yesli Vanderhoff: Magdalene Patricia Treating Graviela Nodal/Extender: STONE III, HOYT Weeks in Treatment: 0 Vital Signs Height(in): 60 Pulse(bpm): 45 Weight(lbs): 131.9 Blood Pressure(mmHg): 148/95 Body Mass Index(BMI): 26 Temperature(F): 97.6 Respiratory Rate 16 (breaths/min): Photos: [1:No Photos] [N/A:N/A] Wound Location: [1:Right Toe Second] [N/A:N/A] Wounding Event: [1:Gradually Appeared] [N/A:N/A] Primary Etiology: [1:Diabetic Wound/Ulcer of the Lower Extremity] [N/A:N/A] Comorbid History:  [1:Cataracts, Hypertension, Type II Diabetes, Osteoarthritis] [N/A:N/A] Date Acquired: [1:06/14/2018] [N/A:N/A] Weeks of Treatment: [1:0] [N/A:N/A] Wound Status: [1:Open] [N/A:N/A] Measurements L x W x D [1:0.1x0.1x0.1] [N/A:N/A] (cm) Area (cm) : [1:0.008] [N/A:N/A] Volume (cm) : [1:0.001] [N/A:N/A] Classification: [1:Grade 1] [N/A:N/A] Exudate Amount: [1:None Present] [N/A:N/A] Wound Margin: [1:Flat and Intact] [N/A:N/A] Granulation Amount: [1:None Present (0%)] [N/A:N/A] Necrotic Amount: [1:None Present (0%)] [N/A:N/A] Exposed Structures: [1:Fascia: No Fat Layer (Subcutaneous Tissue) Exposed: No Tendon: No Muscle: No Joint: No Bone: No Limited to Skin Breakdown] [N/A:N/A] Epithelialization: [1:None] [N/A:N/A] Periwound Skin Texture: [1:Excoriation: No Induration: No Callus: No Crepitus: No Rash: No Scarring: No] [N/A:N/A] Periwound Skin Moisture: [1:Maceration: No Dry/Scaly: No] [N/A:N/A] Periwound Skin Color: [N/A:N/A] Atrophie Blanche: No Cyanosis: No Ecchymosis: No Erythema: No Hemosiderin Staining: No Mottled: No Pallor: No Rubor: No Temperature: No Abnormality N/A N/A Tenderness on Palpation: No N/A N/A Wound Preparation: Ulcer Cleansing: N/A N/A Rinsed/Irrigated with Saline Topical Anesthetic Applied: Other: lidocaine 4% Treatment Notes Electronic Signature(s) Signed: 11/29/2018 5:13:49 PM By: Arnette Norris Entered By: Arnette Norris on 11/29/2018 09:43:19 Jose Ro ([id number]) -------------------------------------------------------------------------------- Multi-Disciplinary Care Plan  Details Patient Name: DANG, MATHISON Date of Service: 11/29/2018 8:45 AM Medical Record Number: [id number] Patient Account Number: [id number] Date of Birth/Sex: 24-Nov-1927 (85 y.o. M) Treating RN: Arnette Norris Primary Care Mikaeel Petrow: Magdalene Patricia Other Clinician: Referring Yoanna Jurczyk: Magdalene Patricia Treating [place] Sorn/Extender: STONE III,  HOYT Weeks in Treatment: 0 Active Inactive Wound/Skin Impairment Nursing Diagnoses: Impaired tissue integrity Knowledge deficit related to ulceration/compromised skin integrity Goals: Ulcer/skin breakdown will have a volume reduction of 30% by week 4 Date Initiated: 11/29/2018 Target Resolution Date: 12/30/2018 Goal Status: Active Interventions: Assess patient/caregiver ability to obtain necessary supplies Assess patient/caregiver ability to perform ulcer/skin care regimen upon admission and as needed Assess ulceration(s) every visit Notes: Electronic Signature(s) Signed: 11/29/2018 5:13:49 PM By: Arnette Norris Entered By: Arnette Norris on 11/29/2018 09:43:04 RODRIGUEZ [name] ([id number]) -------------------------------------------------------------------------------- Pain Assessment Details Patient Name: Jose Ro Date of Service: 11/29/2018 8:45 AM Medical Record Number: [id number] Patient Account Number: [id number] Date of Birth/Sex: [birth year] (85 y.o. M) Treating RN: Arnette Norris Primary Care Eun Vermeer: Magdalene Patricia Other Clinician: Referring Kalif Kattner: Magdalene Patricia Treating Arbutus Nelligan/Extender: STONE III, HOYT Weeks in Treatment: 0 Active Problems Location of Pain Severity and Description of Pain Patient Has Paino Yes Site Locations Rate the pain. Current Pain Level: 4 Pain Management and Medication Current Pain Management: Electronic Signature(s) Signed: 11/29/2018 3:56:35 PM By: Dayton Martes RCP, RRT, CHT Signed: 11/29/2018 5:13:49 PM By: Arnette Norris Entered By: Dayton Martes on 11/29/2018 08:55:50 RODRIGUEZ [name] ([id number]) -------------------------------------------------------------------------------- Patient/Caregiver Education Details Patient Name: Jose Ro Date of Service: 11/29/2018 8:45 AM Medical Record Number: [id number] Patient Account Number:  [id number] Date of Birth/Gender: 05/23/1928 (85 y.o. M) Treating RN: Arnette Norris Primary Care Physician: Magdalene Patricia Other Clinician: Referring Physician: Magdalene Patricia Treating Physician/Extender: Skeet Simmer in Treatment: 0 Education Assessment Education Provided To: Patient Education Topics Provided [facility]: Handouts: [facility] Methods: Demonstration, Explain/Verbal Responses: State content correctly Wound/Skin Impairment: Handouts: Caring for Your Ulcer Methods: Demonstration, Explain/Verbal Responses: State content correctly Electronic Signature(s) Signed: 11/29/2018 5:13:49 PM By: Arnette Norris Entered By: Arnette Norris on 11/29/2018 09:43:54 RODRIGUEZ [place], Para March ([id number]) -------------------------------------------------------------------------------- Wound Assessment Details Patient Name: Jose Ro Date of Service: 11/29/2018 8:45 AM Medical Record Number: [id number] Patient Account Number: [id number] Date of Birth/Sex: August 19, 1928 (85 y.o. M) Treating RN: Curtis Sites Primary Care Malayzia Laforte: Magdalene Patricia Other Clinician: Referring Jayzen Paver: Magdalene Patricia Treating Estefani Bateson/Extender: STONE III, HOYT Weeks in Treatment: 0 Wound Status Wound Number: 1 Primary Diabetic Wound/Ulcer of the Lower Extremity Etiology: Wound Location: Right Toe Second Wound Status: Open Wounding Event: Gradually Appeared Comorbid Cataracts, Hypertension, Type II Diabetes, Date Acquired: 06/14/2018 History: Osteoarthritis Weeks Of Treatment: 0 Clustered Wound: No Photos Photo Uploaded By: Elliot Gurney, BSN, RN, CWS, Kim on 11/29/2018 13:48:38 Wound Measurements Length: (cm) 0.1 Width: (cm) 0.1 Depth: (cm) 0.1 Area: (cm) 0.008 Volume: (cm) 0.001 % Reduction in Area: 0% % Reduction in Volume: 0% Epithelialization: None Tunneling: No Undermining: No Wound Description Classification: Grade 1  Foul Odor Wound Margin: Flat and Intact Slough/Fi Exudate Amount: None Present After Cleansing: No brino No Wound Bed Granulation Amount: Large (67-100%) Exposed Structure Granulation Quality: Pink Fascia Exposed: No Necrotic Amount: None Present (0%) Fat Layer (Subcutaneous Tissue) Exposed: Yes Tendon Exposed: No Muscle Exposed: No Joint Exposed: No Bone Exposed: No Periwound Skin Texture Texture Color No Abnormalities Noted: No No Abnormalities Noted: No RODRIGUEZ Day, Amando ([id number]) Callus: No Atrophie Blanche: No Crepitus: No Cyanosis: No Excoriation:  No Ecchymosis: No Induration: No Erythema: No Rash: No Hemosiderin Staining: No Scarring: No Mottled: No Pallor: No Moisture Rubor: No No Abnormalities Noted: No Dry / Scaly: No Temperature / Pain Maceration: No Temperature: No Abnormality Wound Preparation Ulcer Cleansing: Rinsed/Irrigated with Saline Topical Anesthetic Applied: Other: lidocaine 4%, Treatment Notes Wound #1 (Right Toe Second) Notes silvercel, foam and coverlet Electronic Signature(s) Signed: 11/29/2018 3:56:47 PM By: Curtis Sitesorthy, Joanna Signed: 11/29/2018 9:58:15 PM By: Lenda KelpStone III, Hoyt PA-C Entered By: Lenda KelpStone III, Hoyt on 11/29/2018 11:32:29 RODRIGUEZ [place], Para MarchFRANCISCO ([id number]) -------------------------------------------------------------------------------- Vitals Details Patient Name: Jose RoODRIGUEZ Day, Traves Date of Service: 11/29/2018 8:45 AM Medical Record Number: [id number] Patient Account Number: [id number] Date of Birth/Sex: [birth year] (85 y.o. M) Treating RN: Arnette NorrisBiell, Kristina Primary Care Angi Goodell: Magdalene PatriciaEVELO, ADRIAN Other Clinician: Referring Soliana Kitko: Magdalene PatriciaEVELO, ADRIAN Treating Nicholaos Schippers/Extender: STONE III, HOYT Weeks in Treatment: 0 Vital Signs Time Taken: 08:55 Temperature (F): 97.6 Height (in): 60 Pulse (bpm): 45 Source: Stated Respiratory Rate (breaths/min): 16 Weight (lbs): 131.9 Blood Pressure (mmHg):  148/95 Source: Measured Reference Range: 80 - 120 mg / dl Body Mass Index (BMI): 25.8 Electronic Signature(s) Signed: 11/29/2018 3:56:35 PM By: Dayton MartesWallace, RCP,RRT,CHT, Sallie RCP, RRT, CHT Entered By: Dayton MartesWallace, RCP,RRT,CHT, Sallie on 11/29/2018 09:01:43

## 2018-11-30 NOTE — Progress Notes (Signed)
[name], [name] ([id number]) Visit Report for 11/29/2018 Chief Complaint Document Details Patient Name: Jose Day, Jose Day Date of Service: 11/29/2018 8:45 AM Medical Record Number: [id number] Patient Account Number: [id number] Date of Birth/Sex: 11-Jun-1928 (85 y.o. M) Treating RN: Arnette Norris Primary Care Provider: Magdalene Patricia Other Clinician: Referring Provider: Magdalene Patricia Treating Provider/Extender: Linwood Dibbles, HOYT Weeks in Treatment: 0 Information Obtained from: Patient Chief Complaint Right 2nd toe ulcer Electronic Signature(s) Signed: 11/29/2018 9:58:15 PM By: Lenda Kelp PA-C Entered By: Lenda Kelp on 11/29/2018 11:32:50 RODRIGUEZ [place], Jose Day ([id number]) -------------------------------------------------------------------------------- Debridement Details Patient Name: Jose Day Date of Service: 11/29/2018 8:45 AM Medical Record Number: [id number] Patient Account Number: [id number] Date of Birth/Sex: [birth year] (85 y.o. M) Treating RN: Arnette Norris Primary Care Provider: Magdalene Patricia Other Clinician: Referring Provider: Magdalene Patricia Treating Provider/Extender: STONE III, HOYT Weeks in Treatment: 0 Debridement Performed for Wound #1 Right Toe Second Assessment: Performed By: Physician STONE III, HOYT E., PA-C Debridement Type: Debridement Severity of Tissue Pre Fat layer exposed Debridement: Level of Consciousness (Pre- Awake and Alert procedure): Pre-procedure Verification/Time Yes - 09:45 Out Taken: Start Time: 09:45 Pain Control: Lidocaine Total Area Debrided (L x W): 0.1 (cm) x 0.1 (cm) = 0.01 (cm) Tissue and other material Non-Viable, Skin: Epidermis debrided: Level: Skin/Epidermis Debridement Description: Selective/Open Wound Instrument: Curette Bleeding: None End Time: 09:48 Procedural Pain: 0 Post Procedural Pain: 0 Response to Treatment: Procedure was tolerated well Level of  Consciousness Awake and Alert (Post-procedure): Post Debridement Measurements of Total Wound Length: (cm) 0.1 Width: (cm) 0.1 Depth: (cm) 0.1 Volume: (cm) 0.001 Character of Wound/Ulcer Post Debridement: Improved Severity of Tissue Post Debridement: Fat layer exposed Post Procedure Diagnosis Same as Pre-procedure Electronic Signature(s) Signed: 11/29/2018 5:13:49 PM By: Arnette Norris Signed: 11/29/2018 9:58:15 PM By: Lenda Kelp PA-C Entered By: Arnette Norris on 11/29/2018 09:47:51 RODRIGUEZ Jose Day ([id number]) -------------------------------------------------------------------------------- HPI Details Patient Name: Jose Day Date of Service: 11/29/2018 8:45 AM Medical Record Number: [id number] Patient Account Number: [id number] Date of Birth/Sex: 06-08-1928 (85 y.o. M) Treating RN: Arnette Norris Primary Care Provider: Magdalene Patricia Other Clinician: Referring Provider: Magdalene Patricia Treating Provider/Extender: Linwood Dibbles, HOYT Weeks in Treatment: 0 History of Present Illness HPI Description: 11/29/18 on evaluation today patient presents for initial inspection of clinic concerning an issue that he is been having with his right second toe his daughter tells me since around July 2019. This has been an intermittent issue which apparently has opened and closed since that time. Initially he was utilizing me Pearson subsequently he is now utilizing Neosporin. Nonetheless the issue has been that the area continues to stay wet and it times will become "purplish in color and infected". He has been on antibiotics, doxycycline, multiple times during the course of treatment by urgent care as well is his primary care provider. Unfortunately this never seems to close and stay closed. The patient has had recent blood work performed on November 23, 2018 where he had what appeared to be an excellent profile with a normal CMP, normal lipid panel, and is hemoglobin  A1c was 5.8 and excellent. He is obviously a very well controlled diabetic. Other than this he also has hypertension. Otherwise he is a fairly healthy individual it appears. The main issue right now is just getting this wound to close. There have not been any x-rays nor MRIs of his foot taking up to this point. No cultures have been attained as best I can tell from history. His daughter is present and  functions as an interpreter as well as the patient can speak some English but is definitely not fluent. Her help is appreciated. Electronic Signature(s) Signed: 11/29/2018 9:58:15 PM By: Lenda Kelp PA-C Entered By: Lenda Kelp on 11/29/2018 11:36:37 RODRIGUEZ Jose Day ([id number]) -------------------------------------------------------------------------------- Physical Exam Details Patient Name: Jose Day Date of Service: 11/29/2018 8:45 AM Medical Record Number: [id number] Patient Account Number: [id number] Date of Birth/Sex: [birth year] (85 y.o. M) Treating RN: Arnette Norris Primary Care Provider: Magdalene Patricia Other Clinician: Referring Provider: Magdalene Patricia Treating Provider/Extender: STONE III, HOYT Weeks in Treatment: 0 Constitutional patient is hypotensive.. pulse regular and within target range for patient.Marland Kitchen respirations regular, non-labored and within target range for patient.Marland Kitchen temperature within target range for patient.. Well-nourished and well-hydrated in no acute distress. Eyes conjunctiva clear no eyelid edema noted. pupils equal round and reactive to light and accommodation. Ears, Nose, Mouth, and Throat no gross abnormality of ear auricles or external auditory canals. normal hearing noted during conversation. mucus membranes moist. Respiratory normal breathing without difficulty. clear to auscultation bilaterally. Cardiovascular regular rate and rhythm with normal S1, S2. 2+ dorsalis pedis/posterior tibialis pulses. no clubbing,  cyanosis, significant edema, <3 sec cap refill. Gastrointestinal (GI) soft, non-tender, non-distended, +BS. no ventral hernia noted. Musculoskeletal normal gait and posture. no significant deformity or arthritic changes, no loss or range of motion, no clubbing. Psychiatric this patient is able to make decisions and demonstrates good insight into disease process. Alert and Oriented x 3. pleasant and cooperative. Notes Patient's wound bed currently shows to be a very small almost pinpoint opening on the medial aspect of the right second toe. At this point there does not appear to be any evidence of infection based on my inspection of the area. Fortunately he has been tolerating the dressing changes without complication unfortunately I think this may be causing too much maceration there is some indication that he may have issues with a fungal infection surrounding the wound bed and even in the adjacent lateral portion of the first toe. There is no obvious vascular compromise noted at this point. He has excellent capillary refill and good pulses noted as well. Electronic Signature(s) Signed: 11/29/2018 9:58:15 PM By: Lenda Kelp PA-C Entered By: Lenda Kelp on 11/29/2018 11:37:52 RODRIGUEZ Jose Day ([id number]) -------------------------------------------------------------------------------- Physician Orders Details Patient Name: Jose Day Date of Service: 11/29/2018 8:45 AM Medical Record Number: [id number] Patient Account Number: [id number] Date of Birth/Sex: December 14, 1927 (85 y.o. M) Treating RN: Arnette Norris Primary Care Provider: Magdalene Patricia Other Clinician: Referring Provider: Magdalene Patricia Treating Provider/Extender: Linwood Dibbles, HOYT Weeks in Treatment: 0 Verbal / Phone Orders: No Diagnosis Coding ICD-10 Coding Code Description E11.621 Type 2 diabetes mellitus with foot ulcer L97.512 Non-pressure chronic ulcer of other part of right foot with  fat layer exposed I10 Essential (primary) hypertension Wound Cleansing Wound #1 Right Toe Second o May Shower, gently pat wound dry prior to applying new dressing. Primary Wound Dressing Wound #1 Right Toe Second o Silver Alginate - nystatin powder Secondary Dressing Wound #1 Right Toe Second o Conform/Kerlix o Foam - place foam between [place] and 2nd toe Dressing Change Frequency o Change dressing every other day. Follow-up Appointments Wound #1 Right Toe Second o Return Appointment in 1 week. Off-Loading Wound #1 Right Toe Second o Other: - Continue to wear boot Medications-please add to medication list. o Other: - Prescription of Nystatin Radiology o X-ray, toes - X-ray toes Patient Medications Allergies: No Known Drug Allergies Notifications Medication Indication Start  End nystatin 11/29/2018 RODRIGUEZ [place], [place] ([id number]) Notifications Medication Indication Start End DOSE topical 100,000 unit/gram powder - powder topical applied to the toes of the right foot with each dressing change as directed Electronic Signature(s) Signed: 11/29/2018 11:39:12 AM By: Lenda Kelp PA-C Entered By: Lenda Kelp on 11/29/2018 11:39:11 RODRIGUEZ [place], Jose Day ([id number]) -------------------------------------------------------------------------------- Problem List Details Patient Name: Jose Day Date of Service: 11/29/2018 8:45 AM Medical Record Number: [id number] Patient Account Number: [id number] Date of Birth/Sex: May 25, 1928 (85 y.o. M) Treating RN: Arnette Norris Primary Care Provider: Magdalene Patricia Other Clinician: Referring Provider: Magdalene Patricia Treating Provider/Extender: Linwood Dibbles, HOYT Weeks in Treatment: 0 Active Problems ICD-10 Evaluated Encounter Code Description Active Date Today Diagnosis E11.621 Type 2 diabetes mellitus with foot ulcer 11/29/2018 No Yes L97.512 Non-pressure chronic ulcer of other part of right  foot with fat 11/29/2018 No Yes layer exposed I10 Essential (primary) hypertension 11/29/2018 No Yes Inactive Problems Resolved Problems Electronic Signature(s) Signed: 11/29/2018 9:58:15 PM By: Lenda Kelp PA-C Entered By: Lenda Kelp on 11/29/2018 09:34:24 RODRIGUEZ [place], Jose Day ([id number]) -------------------------------------------------------------------------------- Progress Note Details Patient Name: Jose Day Date of Service: 11/29/2018 8:45 AM Medical Record Number: [id number] Patient Account Number: [id number] Date of Birth/Sex: 01/11/1928 (85 y.o. M) Treating RN: Arnette Norris Primary Care Provider: Magdalene Patricia Other Clinician: Referring Provider: Magdalene Patricia Treating Provider/Extender: Linwood Dibbles, HOYT Weeks in Treatment: 0 Subjective Chief Complaint Information obtained from Patient Right 2nd toe ulcer History of Present Illness (HPI) 11/29/18 on evaluation today patient presents for initial inspection of clinic concerning an issue that he is been having with his right second toe his daughter tells me since around July 2019. This has been an intermittent issue which apparently has opened and closed since that time. Initially he was utilizing me Pearson subsequently he is now utilizing Neosporin. Nonetheless the issue has been that the area continues to stay wet and it times will become "purplish in color and infected". He has been on antibiotics, doxycycline, multiple times during the course of treatment by urgent care as well is his primary care provider. Unfortunately this never seems to close and stay closed. The patient has had recent blood work performed on November 23, 2018 where he had what appeared to be an excellent profile with a normal CMP, normal lipid panel, and is hemoglobin A1c was 5.8 and excellent. He is obviously a very well controlled diabetic. Other than this he also has hypertension. Otherwise he is a fairly healthy  individual it appears. The main issue right now is just getting this wound to close. There have not been any x-rays nor MRIs of his foot taking up to this point. No cultures have been attained as best I can tell from history. His daughter is present and functions as an interpreter as well as the patient can speak some English but is definitely not fluent. Her help is appreciated. Wound History Patient presents with 1 open wound that has been present for approximately 4 months. Patient has been treating wound in the following manner: neosporin. Laboratory tests have been performed in the last month. Patient reportedly has not tested positive for an antibiotic resistant organism. Patient reportedly has not tested positive for osteomyelitis. Patient reportedly has not had testing performed to evaluate circulation in the legs. Patient History Information obtained from Patient, Caregiver. Allergies No Known Drug Allergies Family History No family history of Cancer, Diabetes, Heart Disease, Hereditary Spherocytosis, Hypertension, Kidney Disease, Lung Disease, Seizures, Stroke, Thyroid Problems, Tuberculosis. Social History  Former smoker - quit many years ago, Marital Status - Married, Alcohol Use - Never, Drug Use - No History, Caffeine Use - Rarely. Medical History Eyes Patient has history of Cataracts - removed Denies history of Glaucoma, Optic Neuritis Ear/Nose/Mouth/Throat Denies history of Chronic sinus problems/congestion, Middle ear problems Jose Day, Jose Day ([id number]) Hematologic/Lymphatic Denies history of Anemia, Hemophilia, Human Immunodeficiency Virus, Lymphedema, Sickle Cell Disease Respiratory Denies history of Aspiration, Asthma, Chronic Obstructive Pulmonary Disease (COPD), Pneumothorax, Sleep Apnea, Tuberculosis Cardiovascular Patient has history of Hypertension Denies history of Angina, Arrhythmia, Congestive Heart Failure, Coronary Artery Disease, Deep Vein  Thrombosis, Hypotension, Myocardial Infarction, Peripheral Arterial Disease, Peripheral Venous Disease, Phlebitis, Vasculitis Gastrointestinal Denies history of Cirrhosis , Colitis, Crohn s, Hepatitis A, Hepatitis B, Hepatitis C Endocrine Patient has history of Type II Diabetes Denies history of Type I Diabetes Genitourinary Denies history of End Stage Renal Disease Immunological Denies history of Lupus Erythematosus, Raynaud s, Scleroderma Integumentary (Skin) Denies history of History of Burn, History of pressure wounds Musculoskeletal Patient has history of Osteoarthritis Denies history of Gout, Rheumatoid Arthritis, Osteomyelitis Neurologic Denies history of Dementia, Neuropathy, Quadriplegia, Paraplegia, Seizure Disorder Oncologic Denies history of Received Chemotherapy, Received Radiation Psychiatric Denies history of Anorexia/bulimia, Confinement Anxiety Patient is treated with Controlled Diet. Blood sugar is not tested. Review of Systems (ROS) Constitutional Symptoms (General Health) Denies complaints or symptoms of Fatigue, Fever, Chills, Marked Weight Change. Eyes Denies complaints or symptoms of Dry Eyes, Vision Changes, Glasses / Contacts. Ear/Nose/Mouth/Throat Denies complaints or symptoms of Difficult clearing ears, Sinusitis. Hematologic/Lymphatic Denies complaints or symptoms of Bleeding / Clotting Disorders, Human Immunodeficiency Virus. Respiratory Denies complaints or symptoms of Chronic or frequent coughs, Shortness of Breath. Cardiovascular Complains or has symptoms of LE edema. Denies complaints or symptoms of Chest pain. Gastrointestinal Denies complaints or symptoms of Frequent diarrhea, Nausea, Vomiting. Endocrine Denies complaints or symptoms of Hepatitis, Thyroid disease, Polydypsia (Excessive Thirst). Genitourinary Denies complaints or symptoms of Kidney failure/ Dialysis, Incontinence/dribbling. Immunological Denies complaints or symptoms of  Hives, Itching. Integumentary (Skin) Complains or has symptoms of Wounds. Denies complaints or symptoms of Bleeding or bruising tendency, Breakdown, Swelling. Musculoskeletal Denies complaints or symptoms of Muscle Pain, Muscle Weakness. Neurologic Jose Day, Jose Day ([id number]) Denies complaints or symptoms of Numbness/parasthesias, Focal/Weakness. Psychiatric Denies complaints or symptoms of Anxiety, Claustrophobia. Objective Constitutional patient is hypotensive.. pulse regular and within target range for patient.Marland Kitchen. respirations regular, non-labored and within target range for patient.Marland Kitchen. temperature within target range for patient.. Well-nourished and well-hydrated in no acute distress. Vitals Time Taken: 8:55 AM, Height: 60 in, Source: Stated, Weight: 131.9 lbs, Source: Measured, BMI: 25.8, Temperature: 97.6 F, Pulse: 45 bpm, Respiratory Rate: 16 breaths/min, Blood Pressure: 148/95 mmHg. Eyes conjunctiva clear no eyelid edema noted. pupils equal round and reactive to light and accommodation. Ears, Nose, Mouth, and Throat no gross abnormality of ear auricles or external auditory canals. normal hearing noted during conversation. mucus membranes moist. Respiratory normal breathing without difficulty. clear to auscultation bilaterally. Cardiovascular regular rate and rhythm with normal S1, S2. 2+ dorsalis pedis/posterior tibialis pulses. no clubbing, cyanosis, significant edema, Gastrointestinal (GI) soft, non-tender, non-distended, +BS. no ventral hernia noted. Musculoskeletal normal gait and posture. no significant deformity or arthritic changes, no loss or range of motion, no clubbing. Psychiatric this patient is able to make decisions and demonstrates good insight into disease process. Alert and Oriented x 3. pleasant and cooperative. General Notes: Patient's wound bed currently shows to be a very small almost pinpoint opening on the medial aspect of the  right second  toe. At this point there does not appear to be any evidence of infection based on my inspection of the area. Fortunately he has been tolerating the dressing changes without complication unfortunately I think this may be causing too much maceration there is some indication that he may have issues with a fungal infection surrounding the wound bed and even in the adjacent lateral portion of the first toe. There is no obvious vascular compromise noted at this point. He has excellent capillary refill and good pulses noted as well. Integumentary (Hair, Skin) Wound #1 status is Open. Original cause of wound was Gradually Appeared. The wound is located on the Right Toe Second. The wound measures 0.1cm length x 0.1cm width x 0.1cm depth; 0.008cm^2 area and 0.001cm^3 volume. There is Fat Layer (Subcutaneous Tissue) Exposed exposed. There is no tunneling or undermining noted. There is a none present amount of drainage noted. The wound margin is flat and intact. There is large (67-100%) pink granulation within the wound bed. There is no necrotic tissue within the wound bed. The periwound skin appearance did not exhibit: Callus, Crepitus, Excoriation, RODRIGUEZ Day, Jose Day ([id number]) Induration, Rash, Scarring, Dry/Scaly, Maceration, Atrophie Blanche, Cyanosis, Ecchymosis, Hemosiderin Staining, Mottled, Pallor, Rubor, Erythema. Periwound temperature was noted as No Abnormality. Assessment Active Problems ICD-10 Type 2 diabetes mellitus with foot ulcer Non-pressure chronic ulcer of other part of right foot with fat layer exposed Essential (primary) hypertension Procedures Wound #1 Pre-procedure diagnosis of Wound #1 is a Diabetic Wound/Ulcer of the Lower Extremity located on the Right Toe Second .Severity of Tissue Pre Debridement is: Fat layer exposed. There was a Selective/Open Wound Skin/Epidermis Debridement with a total area of 0.01 sq cm performed by STONE III, HOYT E., PA-C. With the  following instrument(s): Curette to remove Non-Viable tissue/material. Material removed includes Skin: Epidermis after achieving pain control using Lidocaine. No specimens were taken. A time out was conducted at 09:45, prior to the start of the procedure. There was no bleeding. The procedure was tolerated well with a pain level of 0 throughout and a pain level of 0 following the procedure. Post Debridement Measurements: 0.1cm length x 0.1cm width x 0.1cm depth; 0.001cm^3 volume. Character of Wound/Ulcer Post Debridement is improved. Severity of Tissue Post Debridement is: Fat layer exposed. Post procedure Diagnosis Wound #1: Same as Pre-Procedure Plan Wound Cleansing: Wound #1 Right Toe Second: May Shower, gently pat wound dry prior to applying new dressing. Primary Wound Dressing: Wound #1 Right Toe Second: Silver Alginate - nystatin powder Secondary Dressing: Wound #1 Right Toe Second: Conform/Kerlix Foam - place foam between [place] and 2nd toe Dressing Change Frequency: Change dressing every other day. Follow-up Appointments: Wound #1 Right Toe Second: Return Appointment in 1 week. Off-Loading: Wound #1 Right Toe Second: Other: - Continue to wear boot JAUN, GALLUZZO ([id number]) Medications-please add to medication list.: Other: - Prescription of Nystatin Radiology ordered were: X-ray, toes - X-ray toes The following medication(s) was prescribed: nystatin topical 100,000 unit/gram powder powder topical applied to the toes of the right foot with each dressing change as directed starting 11/29/2018 My suggestion at this point is gonna be that we go ahead and initiate treatment with the above wound care measures. This will include utilizing the nystatin powder as directed and then utilizing the silver alginate dressing and a piece of foam in order to help cushion as well as absorb fluid so that this area will not become macerated which I think is been part of the issue  up  to this point as well. The patient is in agreement with the plan. We will subsequently see were things stand at follow-up. I am gonna order an x-ray of the toes in order to ensure there's no evidence of osteomyelitis or structure of the melody that could be causing an issue here as well. Please see above for specific wound care orders. We will see patient for re-evaluation in 1 week(s) here in the clinic. If anything worsens or changes patient will contact our office for additional recommendations. Electronic Signature(s) Signed: 11/29/2018 9:58:15 PM By: Lenda KelpStone III, Hoyt PA-C Entered By: Lenda KelpStone III, Hoyt on 11/29/2018 11:40:[address]ODRIGUEZ Day, Jose Day ([id number]) -------------------------------------------------------------------------------- ROS/PFSH Details Patient Name: Jose RoODRIGUEZ Day, Jose Day Date of Service: 11/29/2018 8:45 AM Medical Record Number: [id number] Patient Account Number: [id number] Date of Birth/Sex: [birth year] (85 y.o. M) Treating RN: Curtis Sitesorthy, Joanna Primary Care Provider: Magdalene PatriciaEVELO, ADRIAN Other Clinician: Referring Provider: Magdalene PatriciaEVELO, ADRIAN Treating Provider/Extender: STONE III, HOYT Weeks in Treatment: 0 Information Obtained From Patient Caregiver Wound History Do you currently have one or more open woundso Yes How many open wounds do you currently haveo 1 Approximately how long have you had your woundso 4 months How have you been treating your wound(s) until nowo neosporin Has your wound(s) ever healed and then re-openedo No Have you had any lab work done in the past montho Yes Who ordered the lab work doneo PCP Have you tested positive for an antibiotic resistant organism (MRSA, VRE)o No Have you tested positive for osteomyelitis (bone infection)o No Have you had any tests for circulation on your legso No Constitutional Symptoms (General Health) Complaints and Symptoms: Negative for: Fatigue; Fever; Chills; Marked Weight Change Eyes Complaints and  Symptoms: Negative for: Dry Eyes; Vision Changes; Glasses / Contacts Medical History: Positive for: Cataracts - removed Negative for: Glaucoma; Optic Neuritis Ear/Nose/Mouth/Throat Complaints and Symptoms: Negative for: Difficult clearing ears; Sinusitis Medical History: Negative for: Chronic sinus problems/congestion; Middle ear problems Hematologic/Lymphatic Complaints and Symptoms: Negative for: Bleeding / Clotting Disorders; Human Immunodeficiency Virus Medical History: Negative for: Anemia; Hemophilia; Human Immunodeficiency Virus; Lymphedema; Sickle Cell Disease Respiratory Complaints and Symptoms: Negative for: Chronic or frequent coughs; Shortness of Breath Jose Day, Jose Day ([id number]) Medical History: Negative for: Aspiration; Asthma; Chronic Obstructive Pulmonary Disease (COPD); Pneumothorax; Sleep Apnea; Tuberculosis Cardiovascular Complaints and Symptoms: Positive for: LE edema Negative for: Chest pain Medical History: Positive for: Hypertension Negative for: Angina; Arrhythmia; Congestive Heart Failure; Coronary Artery Disease; Deep Vein Thrombosis; Hypotension; Myocardial Infarction; Peripheral Arterial Disease; Peripheral Venous Disease; Phlebitis; Vasculitis Gastrointestinal Complaints and Symptoms: Negative for: Frequent diarrhea; Nausea; Vomiting Medical History: Negative for: Cirrhosis ; Colitis; Crohnos; Hepatitis A; Hepatitis B; Hepatitis C Endocrine Complaints and Symptoms: Negative for: Hepatitis; Thyroid disease; Polydypsia (Excessive Thirst) Medical History: Positive for: Type II Diabetes Negative for: Type I Diabetes Treated with: Diet Blood sugar tested every day: No Genitourinary Complaints and Symptoms: Negative for: Kidney failure/ Dialysis; Incontinence/dribbling Medical History: Negative for: End Stage Renal Disease Immunological Complaints and Symptoms: Negative for: Hives; Itching Medical History: Negative for: Lupus  Erythematosus; Raynaudos; Scleroderma Integumentary (Skin) Complaints and Symptoms: Positive for: Wounds Negative for: Bleeding or bruising tendency; Breakdown; Swelling Medical History: Negative for: History of Burn; History of pressure wounds RODRIGUEZ Day, Jose Day ([id number]) Musculoskeletal Complaints and Symptoms: Negative for: Muscle Pain; Muscle Weakness Medical History: Positive for: Osteoarthritis Negative for: Gout; Rheumatoid Arthritis; Osteomyelitis Neurologic Complaints and Symptoms: Negative for: Numbness/parasthesias; Focal/Weakness Medical History: Negative for: Dementia; Neuropathy; Quadriplegia; Paraplegia; Seizure Disorder Psychiatric Complaints and Symptoms: Negative for:  Anxiety; Claustrophobia Medical History: Negative for: Anorexia/bulimia; Confinement Anxiety Oncologic Medical History: Negative for: Received Chemotherapy; Received Radiation HBO Extended History Items Eyes: Cataracts Immunizations Pneumococcal Vaccine: Received Pneumococcal Vaccination: Yes Immunization Notes: up to date Implantable Devices Family and Social History Cancer: No; Diabetes: No; Heart Disease: No; Hereditary Spherocytosis: No; Hypertension: No; Kidney Disease: No; Lung Disease: No; Seizures: No; Stroke: No; Thyroid Problems: No; Tuberculosis: No; Former smoker - quit many years ago; Marital Status - Married; Alcohol Use: Never; Drug Use: No History; Caffeine Use: Rarely; Financial Concerns: No; Food, Clothing or Shelter Needs: No; Support System Lacking: No; Transportation Concerns: No; Advanced Directives: Yes; Patient does not want information on Advanced Directives Electronic Signature(s) Signed: 11/29/2018 3:56:47 PM By: Curtis Sites Signed: 11/29/2018 9:58:15 PM By: Lenda Kelp PA-C Entered By: Curtis Sites on 11/29/2018 09:14:34 RODRIGUEZ Decorah, Jose Day  ([id number]) -------------------------------------------------------------------------------- SuperBill Details Patient Name: Jose Day Date of Service: 11/29/2018 Medical Record Number: [id number] Patient Account Number: [id number] Date of Birth/Sex: Day 30, 1929 (85 y.o. M) Treating RN: Arnette Norris Primary Care Provider: Magdalene Patricia Other Clinician: Referring Provider: Magdalene Patricia Treating Provider/Extender: Linwood Dibbles, HOYT Weeks in Treatment: 0 Diagnosis Coding ICD-10 Codes Code Description E11.621 Type 2 diabetes mellitus with foot ulcer L97.512 Non-pressure chronic ulcer of other part of right foot with fat layer exposed I10 Essential (primary) hypertension Facility Procedures CPT4 Code: 32951884 Description: 99213 - WOUND CARE VISIT-LEV 3 EST PT Modifier: Quantity: 1 CPT4 Code: 16606301 Description: 97597 - DEBRIDE WOUND 1ST 20 SQ CM OR < ICD-10 Diagnosis Description L97.512 Non-pressure chronic ulcer of other part of right foot with fat Modifier: layer exposed Quantity: 1 Physician Procedures CPT4 Code: 6010932 Description: 99204 - WC PHYS LEVEL 4 - NEW PT ICD-10 Diagnosis Description E11.621 Type 2 diabetes mellitus with foot ulcer L97.512 Non-pressure chronic ulcer of other part of right foot with fat I10 Essential (primary) hypertension Modifier: 25 layer exposed Quantity: 1 CPT4 Code: 3557322 Description: 97597 - WC PHYS DEBR WO ANESTH 20 SQ CM ICD-10 Diagnosis Description L97.512 Non-pressure chronic ulcer of other part of right foot with fat Modifier: layer exposed Quantity: 1 Electronic Signature(s) Signed: 11/29/2018 9:58:15 PM By: Lenda Kelp PA-C Entered By: Lenda Kelp on 11/29/2018 11:40:26

## 2018-12-06 ENCOUNTER — Encounter: Payer: Medicare Other | Admitting: Physician Assistant

## 2018-12-06 DIAGNOSIS — E11622 Type 2 diabetes mellitus with other skin ulcer: Secondary | ICD-10-CM | POA: Diagnosis not present

## 2018-12-12 NOTE — Progress Notes (Addendum)
Jose Day, Jose Day ([id number]) Visit Report for 12/06/2018 Arrival Information Details Patient Name: Jose Day, Jose Day Date of Service: 12/06/2018 3:45 PM Medical Record Number: [id number] Patient Account Number: [id number] Date of Birth/Sex: 1928-06-13 (85 y.o. M) Treating RN: Jose Day Primary Care Srah Ake: Jose Day Other Clinician: Referring Jose Day: Jose Day Treating Mahaley Schwering/Extender: Jose Day Weeks in Treatment: 1 Visit Information History Since Last Visit Added or deleted any medications: No Patient Arrived: Ambulatory Any new allergies or adverse reactions: No Arrival Time: 15:43 Had a fall or experienced change in No Accompanied By: daughter, activities of daily living that may affect enterpreter risk of falls: Transfer Assistance: None Signs or symptoms of abuse/neglect since last visito No Patient Identification Verified: Yes Hospitalized since last visit: No Secondary Verification Process Yes Implantable device outside of the clinic excluding No Completed: cellular tissue based products placed in the center since last visit: Has Dressing in Place as Prescribed: Yes Pain Present Now: Yes Electronic Signature(s) Signed: 12/06/2018 4:21:09 PM By: Jose Day Entered By: Jose Day on 12/06/2018 15:45:20 Jose Day, Jose Day ([id number]) -------------------------------------------------------------------------------- Clinic Level of Care Assessment Details Patient Name: Jose Day, Jose Day Date of Service: 12/06/2018 3:45 PM Medical Record Number: [id number] Patient Account Number: [id number] Date of Birth/Sex: 1928-06-13 (85 y.o. M) Treating RN: Jose Day Primary Care Triana Coover: Jose Day Other Clinician: Referring Dalynn Jhaveri: Jose Day Treating Netta Fodge/Extender: Jose Day Weeks in Treatment: 1 Clinic Level of Care Assessment Items TOOL 4 Quantity  Score []  - Use when only an EandM is performed on FOLLOW-UP visit 0 ASSESSMENTS - Nursing Assessment / Reassessment []  - Reassessment of Co-morbidities (includes updates in patient status) 0 X- 1 5 Reassessment of Adherence to Treatment Plan ASSESSMENTS - Wound and Skin Assessment / Reassessment X - Simple Wound Assessment / Reassessment - one wound 1 5 []  - 0 Complex Wound Assessment / Reassessment - multiple wounds []  - 0 Dermatologic / Skin Assessment (not related to wound area) ASSESSMENTS - Focused Assessment []  - Circumferential Edema Measurements - multi extremities 0 []  - 0 Nutritional Assessment / Counseling / Intervention []  - 0 Lower Extremity Assessment (monofilament, tuning fork, pulses) []  - 0 Peripheral Arterial Disease Assessment (using hand held doppler) ASSESSMENTS - Ostomy and/or Continence Assessment and Care []  - Incontinence Assessment and Management 0 []  - 0 Ostomy Care Assessment and Management (repouching, etc.) PROCESS - Coordination of Care X - Simple Patient / Family Education for ongoing care 1 15 []  - 0 Complex (extensive) Patient / Family Education for ongoing care []  - 0 Staff obtains ChiropractorConsents, Records, Test Results / Process Orders []  - 0 Staff telephones HHA, Nursing Homes / Clarify orders / etc []  - 0 Routine Transfer to another Facility (non-emergent condition) []  - 0 Routine Hospital Admission (non-emergent condition) []  - 0 New Admissions / Manufacturing engineernsurance Authorizations / Ordering NPWT, Apligraf, etc. []  - 0 Emergency Hospital Admission (emergent condition) X- 1 10 Simple Discharge Coordination Jose Day Day, Jose Day ([id number]) []  - 0 Complex (extensive) Discharge Coordination PROCESS - Special Needs []  - Pediatric / Minor Patient Management 0 []  - 0 Isolation Patient Management []  - 0 Hearing / Language / Visual special needs []  - 0 Assessment of Community assistance (transportation, D/C planning, etc.) []  - 0 Additional  assistance / Altered mentation []  - 0 Support Surface(s) Assessment (bed, cushion, seat, etc.) INTERVENTIONS - Wound Cleansing / Measurement X - Simple Wound Cleansing - one wound 1 5 []  - 0 Complex Wound Cleansing - multiple wounds  X- 1 5 Wound Imaging (photographs - any number of wounds) []  - 0 Wound Tracing (instead of photographs) X- 1 5 Simple Wound Measurement - one wound []  - 0 Complex Wound Measurement - multiple wounds INTERVENTIONS - Wound Dressings []  - Small Wound Dressing one or multiple wounds 0 X- 1 15 Medium Wound Dressing one or multiple wounds []  - 0 Large Wound Dressing one or multiple wounds []  - 0 Application of Medications - topical []  - 0 Application of Medications - injection INTERVENTIONS - Miscellaneous []  - External ear exam 0 []  - 0 Specimen Collection (cultures, biopsies, blood, body fluids, etc.) []  - 0 Specimen(s) / Culture(s) sent or taken to Lab for analysis []  - 0 Patient Transfer (multiple staff / Nurse, adult / Similar devices) []  - 0 Simple Staple / Suture removal (25 or less) []  - 0 Complex Staple / Suture removal (26 or more) []  - 0 Hypo / Hyperglycemic Management (close monitor of Blood Glucose) []  - 0 Ankle / Brachial Index (ABI) - do not check if billed separately []  - 0 Vital Signs Jose Day, Jose Day ([id number]) Has the patient been seen at the hospital within the last three years: Yes Total Score: 65 Level Of Care: New/Established - Level 2 Electronic Signature(s) Signed: 12/06/2018 4:45:46 PM By: Jose Gurney, BSN, RN, CWS, Kim RN, BSN Entered By: Jose Day on 12/06/2018 16:25:36 Jose Day ([id number]) -------------------------------------------------------------------------------- Encounter Discharge Information Details Patient Name: Jose Day Date of Service: 12/06/2018 3:45 PM Medical Record Number: [id number] Patient Account Number: [id number] Date of Birth/Sex:  [birth year] (85 y.o. M) Treating RN: Jose Coventry Primary Care Baily Hovanec: Jose Patricia Other Clinician: Referring Marckus Hanover: Jose Patricia Treating Karlin Heilman/Extender: Jose Dibbles, Day Weeks in Treatment: 1 Encounter Discharge Information Items Discharge Condition: Stable Ambulatory Status: Ambulatory Discharge Destination: Home Transportation: Private Auto Accompanied By: daughter Schedule Follow-up Appointment: Yes Clinical Summary of Care: Electronic Signature(s) Signed: 12/06/2018 4:45:46 PM By: Jose Gurney, BSN, RN, CWS, Kim RN, BSN Entered By: Jose Day on 12/06/2018 16:27:46 Jose Day ([id number]) -------------------------------------------------------------------------------- Lower Extremity Assessment Details Patient Name: Jose Day Date of Service: 12/06/2018 3:45 PM Medical Record Number: [id number] Patient Account Number: [id number] Date of Birth/Sex: [birth year] (85 y.o. M) Treating RN: Jose Coventry Primary Care Ellason Segar: Jose Patricia Other Clinician: Referring Lovey Crupi: Jose Patricia Treating Anye Brose/Extender: Jose Dibbles, Day Weeks in Treatment: 1 Edema Assessment Assessed: [Left: No] [Right: No] E[Left: dema] [Right: :] Calf Left: Right: Point of Measurement: 30 cm From Medial Instep cm 33 cm Ankle Left: Right: Point of Measurement: 13 cm From Medial Instep cm 21 cm Vascular Assessment Claudication: Claudication Assessment [Right:None] Pulses: Dorsalis Pedis Palpable: [Right:Yes] Posterior Tibial Extremity colors, hair growth, and conditions: Extremity Color: [Right:Normal] Hair Growth on Extremity: [Right:No] Temperature of Extremity: [Right:Cool] Capillary Refill: [Right:< 3 seconds] Toe Nail Assessment Left: Right: Thick: No Discolored: No Deformed: No Improper Length and Hygiene: No Electronic Signature(s) Signed: 12/06/2018 4:21:09 PM By: Jose Martes RCP, RRT, Day Signed: 12/06/2018  4:45:46 PM By: Jose Gurney, BSN, RN, CWS, Kim RN, BSN Entered By: Jose Martes on 12/06/2018 15:53:56 Jose Day ([id number]) -------------------------------------------------------------------------------- Multi Wound Chart Details Patient Name: Jose Day Date of Service: 12/06/2018 3:45 PM Medical Record Number: [id number] Patient Account Number: [id number] Date of Birth/Sex: 18-Nov-1927 (85 y.o. M) Treating RN: Jose Coventry Primary Care Maida Widger: Jose Patricia Other Clinician: Referring Tammra Pressman: Jose Patricia Treating Alvis Pulcini/Extender: STONE III, Day Weeks in Treatment: 1 Vital Signs Height(in): 60 Pulse(bpm): 78 Weight(lbs):  131.9 Blood Pressure(mmHg): 128/80 Body Mass Index(BMI): 26 Temperature(F): 98.1 Respiratory Rate 16 (breaths/min): Photos: [N/A:N/A] Wound Location: Right Toe Second N/A N/A Wounding Event: Gradually Appeared N/A N/A Primary Etiology: Diabetic Wound/Ulcer of the N/A N/A Lower Extremity Comorbid History: Cataracts, Hypertension, Type N/A N/A II Diabetes, Osteoarthritis Date Acquired: 06/14/2018 N/A N/A Weeks of Treatment: 1 N/A N/A Wound Status: Open N/A N/A Measurements L x W x D 0.1x0.1x0.1 N/A N/A (cm) Area (cm) : 0.008 N/A N/A Volume (cm) : 0.001 N/A N/A % Reduction in Area: 0.00% N/A N/A % Reduction in Volume: 0.00% N/A N/A Classification: Grade 1 N/A N/A Exudate Amount: None Present N/A N/A Wound Margin: Flat and Intact N/A N/A Granulation Amount: None Present (0%) N/A N/A Necrotic Amount: None Present (0%) N/A N/A Exposed Structures: Fat Layer (Subcutaneous N/A N/A Tissue) Exposed: Yes Fascia: No Tendon: No Muscle: No Joint: No Bone: No Epithelialization: None N/A N/A Periwound Skin Texture: N/A N/A CLIDE REMMERS, Dimitris ([id number]) Excoriation: No Induration: No Callus: No Crepitus: No Rash: No Scarring: No Periwound Skin Moisture: Maceration: No N/A N/A Dry/Scaly:  No Periwound Skin Color: Atrophie Blanche: No N/A N/A Cyanosis: No Ecchymosis: No Erythema: No Hemosiderin Staining: No Mottled: No Pallor: No Rubor: No Temperature: No Abnormality N/A N/A Tenderness on Palpation: No N/A N/A Wound Preparation: Ulcer Cleansing: N/A N/A Rinsed/Irrigated with Saline Topical Anesthetic Applied: Other: lidocaine 4% Treatment Notes Electronic Signature(s) Signed: 12/06/2018 4:45:46 PM By: Jose Gurney, BSN, RN, CWS, Kim RN, BSN Entered By: Jose Day on 12/06/2018 16:15:05 Jose Day, Jose Day ([id number]) -------------------------------------------------------------------------------- Multi-Disciplinary Care Plan Details Patient Name: Jose Day Date of Service: 12/06/2018 3:45 PM Medical Record Number: [id number] Patient Account Number: [id number] Date of Birth/Sex: Apr 07, 1928 (85 y.o. M) Treating RN: Jose Coventry Primary Care Akylah Hascall: Jose Patricia Other Clinician: Referring Byrant Valent: Jose Patricia Treating Ralphine Hinks/Extender: Skeet Simmer in Treatment: 1 Active Inactive Electronic Signature(s) Signed: 12/27/2018 3:11:58 PM By: Jose Gurney, BSN, RN, CWS, Kim RN, BSN Previous Signature: 12/06/2018 4:45:46 PM Version By: Jose Gurney, BSN, RN, CWS, Kim RN, BSN Entered By: Jose Day on 12/27/2018 15:11:57 Jose Day ([id number]) -------------------------------------------------------------------------------- Pain Assessment Details Patient Name: Jose Day Date of Service: 12/06/2018 3:45 PM Medical Record Number: [id number] Patient Account Number: [id number] Date of Birth/Sex: 1928-03-01 (85 y.o. M) Treating RN: Jose Coventry Primary Care Cadence Haslam: Jose Patricia Other Clinician: Referring Jahleah Mariscal: Jose Patricia Treating Seven Dollens/Extender: Jose Dibbles, Day Weeks in Treatment: 1 Active Problems Location of Pain Severity and Description of Pain Patient Has Paino Yes Site  Locations Rate the pain. Current Pain Level: 3 Pain Management and Medication Current Pain Management: Electronic Signature(s) Signed: 12/06/2018 4:21:09 PM By: Jose Martes RCP, RRT, Day Signed: 12/06/2018 4:45:46 PM By: Jose Gurney, BSN, RN, CWS, Kim RN, BSN Entered By: Jose Martes on 12/06/2018 15:47:17 Jose Day ([id number]) -------------------------------------------------------------------------------- Patient/Caregiver Education Details Patient Name: Jose Day Date of Service: 12/06/2018 3:45 PM Medical Record Number: [id number] Patient Account Number: [id number] Date of Birth/Gender: 1928/03/12 (85 y.o. M) Treating RN: Jose Coventry Primary Care Physician: Jose Patricia Other Clinician: Referring Physician: Magdalene Patricia Treating Physician/Extender: Skeet Simmer in Treatment: 1 Education Assessment Education Provided To: Patient Education Topics Provided Wound/Skin Impairment: Handouts: Caring for Your Ulcer Methods: Demonstration, Explain/Verbal Responses: State content correctly Electronic Signature(s) Signed: 12/06/2018 4:45:46 PM By: Jose Gurney, BSN, RN, CWS, Kim RN, BSN Entered By: Jose Day on 12/06/2018 16:25:57 Jose Day ([id number]) -------------------------------------------------------------------------------- Wound Assessment Details Patient Name: IOEVOJJKK  Dwana Day, Derran Date of Service: 12/06/2018 3:45 PM Medical Record Number: [id number] Patient Account Number: [id number] Date of Birth/Sex: [birth year] (85 y.o. M) Treating RN: Jose Day Primary Care Camila Norville: Jose Day Other Clinician: Referring Doria Fern: Jose Day Treating Aleisa Howk/Extender: STONE III, Day Weeks in Treatment: 1 Wound Status Wound Number: 1 Primary Diabetic Wound/Ulcer of the Lower Extremity Etiology: Wound Location: Right Toe Second Wound Status: Healed -  Epithelialized Wounding Event: Gradually Appeared Comorbid Cataracts, Hypertension, Type II Diabetes, Date Acquired: 06/14/2018 History: Osteoarthritis Weeks Of Treatment: 1 Clustered Wound: No Photos Photo Uploaded By: Rema JasmineNg, Wendi on 12/06/2018 15:57:29 Wound Measurements Length: (cm) 0 % Reduc Width: (cm) 0 % Reduc Depth: (cm) 0 Epithel Area: (cm) 0 Tunnel Volume: (cm) 0 Underm tion in Area: 100% tion in Volume: 100% ialization: None ing: No ining: No Wound Description Classification: Grade 1 Foul Od Wound Margin: Flat and Intact Slough/ Exudate Amount: None Present or After Cleansing: No Fibrino No Wound Bed Granulation Amount: None Present (0%) Exposed Structure Necrotic Amount: None Present (0%) Fascia Exposed: No Fat Layer (Subcutaneous Tissue) Exposed: Yes Tendon Exposed: No Muscle Exposed: No Joint Exposed: No Bone Exposed: No Periwound Skin Texture Texture Color No Abnormalities Noted: No No Abnormalities Noted: No Jose Day, Demeco ([id number]) Callus: No Atrophie Blanche: No Crepitus: No Cyanosis: No Excoriation: No Ecchymosis: No Induration: No Erythema: No Rash: No Hemosiderin Staining: No Scarring: No Mottled: No Pallor: No Moisture Rubor: No No Abnormalities Noted: No Dry / Scaly: No Temperature / Pain Maceration: No Temperature: No Abnormality Wound Preparation Ulcer Cleansing: Rinsed/Irrigated with Saline Topical Anesthetic Applied: Other: lidocaine 4%, Treatment Notes Wound #1 (Right Toe Second) Notes Nystatin and silvercell Electronic Signature(s) Signed: 12/30/2018 7:49:08 AM By: Jose GurneyWoody, BSN, RN, CWS, Kim RN, BSN Previous Signature: 12/06/2018 4:21:09 PM Version By: Jose Day Previous Signature: 12/06/2018 4:45:46 PM Version By: Jose GurneyWoody, BSN, RN, CWS, Kim RN, BSN Entered By: Jose GurneyWoody, BSN, RN, CWS, Day on 12/27/2018 15:11:39 Jose Dwana MelenaEGRON, Boruch  ([id number]) -------------------------------------------------------------------------------- Vitals Details Patient Name: Jose Day, Artavius Date of Service: 12/06/2018 3:45 PM Medical Record Number: [id number] Patient Account Number: [id number] Date of Birth/Sex: [birth year] (85 y.o. M) Treating RN: Jose Day Primary Care Alliene Klugh: Jose Day Other Clinician: Referring Cristen Murcia: Jose Day Treating Hartford Maulden/Extender: Jose Day Weeks in Treatment: 1 Vital Signs Time Taken: 15:47 Temperature (F): 98.1 Height (in): 60 Pulse (bpm): 78 Weight (lbs): 131.9 Respiratory Rate (breaths/min): 16 Body Mass Index (BMI): 25.8 Blood Pressure (mmHg): 128/80 Reference Range: 80 - 120 mg / dl Airway Electronic Signature(s) Signed: 12/06/2018 4:21:09 PM By: Jose Day Entered By: Jose Day on 12/06/2018 15:49:47

## 2018-12-13 ENCOUNTER — Ambulatory Visit: Payer: Medicare Other | Admitting: Physician Assistant

## 2018-12-14 ENCOUNTER — Ambulatory Visit: Payer: Medicare Other | Admitting: Physician Assistant

## 2018-12-30 NOTE — Progress Notes (Signed)
Jose Day, Jose Day ([id number]) Visit Report for 12/06/2018 Chief Complaint Document Details Patient Name: Jose Day, Jose Day Date of Service: 12/06/2018 3:45 PM Medical Record Number: [id number] Patient Account Number: [id number] Date of Birth/Sex: [birth year] (85 y.o. M) Treating RN: Huel CoventryWoody, Kim Primary Care Provider: Magdalene PatriciaEVELO, ADRIAN Other Clinician: Referring Provider: Magdalene PatriciaEVELO, ADRIAN Treating Provider/Extender: Linwood DibblesSTONE III, HOYT Weeks in Treatment: 1 Information Obtained from: Patient Chief Complaint Right 2nd toe ulcer Electronic Signature(s) Signed: 12/10/2018 9:05:33 PM By: Lenda KelpStone III, Hoyt PA-C Entered By: Lenda KelpStone III, Hoyt on 12/06/2018 16:13:20 Jose Day, Jose Day ([id number]) -------------------------------------------------------------------------------- HPI Details Patient Name: Jose Day, Jose Day Date of Service: 12/06/2018 3:45 PM Medical Record Number: [id number] Patient Account Number: [id number] Date of Birth/Sex: [birth year] (85 y.o. M) Treating RN: Huel CoventryWoody, Kim Primary Care Provider: Magdalene PatriciaEVELO, ADRIAN Other Clinician: Referring Provider: Magdalene PatriciaEVELO, ADRIAN Treating Provider/Extender: Linwood DibblesSTONE III, HOYT Weeks in Treatment: 1 History of Present Illness HPI Description: 11/29/18 on evaluation today patient presents for initial inspection of clinic concerning an issue that he is been having with his right second toe his daughter tells me since around July 2019. This has been an intermittent issue which apparently has opened and closed since that time. Initially he was utilizing me Pearson subsequently he is now utilizing Neosporin. Nonetheless the issue has been that the area continues to stay wet and it times will become "purplish in color and infected". He has been on antibiotics, doxycycline, multiple times during the course of treatment by urgent care as well is his primary care provider. Unfortunately this never seems to close and stay closed. The patient  has had recent blood work performed on November 23, 2018 where he had what appeared to be an excellent profile with a normal CMP, normal lipid panel, and is hemoglobin A1c was 5.8 and excellent. He is obviously a very well controlled diabetic. Other than this he also has hypertension. Otherwise he is a fairly healthy individual it appears. The main issue right now is just getting this wound to close. There have not been any x-rays nor MRIs of his foot taking up to this point. No cultures have been attained as best I can tell from history. His daughter is present and functions as an interpreter as well as the patient can speak some English but is definitely not fluent. Her help is appreciated. 12/06/18 on evaluation today patient appears to be doing better in regard to the ulcer on the right medial second toe. This is a very small area and upon evaluation today I'm not even sure that this isn't completely healed at this point. It's definitely doing better. There does not appear to be any signs of infection at this time. Do believe that nystatin has been of benefit for him along with the alternate to keep everything nice and dry. His daughter is likewise pleased Electronic Signature(s) Signed: 12/10/2018 9:05:33 PM By: Lenda KelpStone III, Hoyt PA-C Entered By: Lenda KelpStone III, Hoyt on 12/10/2018 [address]21:03:26 Jose Dwana MelenaEGRON, Huck ([id number]) -------------------------------------------------------------------------------- Physical Exam Details Patient Name: Jose Day, Ariyan Date of Service: 12/06/2018 3:45 PM Medical Record Number: [id number] Patient Account Number: [id number] Date of Birth/Sex: [birth year] (85 y.o. M) Treating RN: Huel CoventryWoody, Kim Primary Care Provider: Magdalene PatriciaEVELO, ADRIAN Other Clinician: Referring Provider: Magdalene PatriciaEVELO, ADRIAN Treating Provider/Extender: STONE III, HOYT Weeks in Treatment: 1 Constitutional Well-nourished and well-hydrated in no acute distress. Respiratory normal breathing  without difficulty. clear to auscultation bilaterally. Cardiovascular regular rate and rhythm with normal S1, S2. Psychiatric this patient is able to make decisions and demonstrates good insight into disease process.  Alert and Oriented x 3. pleasant and cooperative. Notes On evaluation today patient's wound did not require any sharp debridement in fact this just appears to be a very small opening if it's even truly open at all. It definitely appears to be doing much better which is excellent news. Overall I'm very pleased with the progress that he has made. The patient doesn't seem to be having any pain as he was in the beginning. This is likewise good news. Electronic Signature(s) Signed: 12/10/2018 9:05:33 PM By: Lenda Kelp PA-C Entered By: Lenda Kelp on 12/10/2018 [address] ([id number]) -------------------------------------------------------------------------------- Physician Orders Details Patient Name: Jose Ro Date of Service: 12/06/2018 3:45 PM Medical Record Number: [id number] Patient Account Number: [id number] Date of Birth/Sex: 12-Jul-1928 (85 y.o. M) Treating RN: Huel Coventry Primary Care Provider: Magdalene Patricia Other Clinician: Referring Provider: Magdalene Patricia Treating Provider/Extender: Linwood Dibbles, HOYT Weeks in Treatment: 1 Verbal / Phone Orders: No Diagnosis Coding ICD-10 Coding Code Description E11.621 Type 2 diabetes mellitus with foot ulcer L97.512 Non-pressure chronic ulcer of other part of right foot with fat layer exposed I10 Essential (primary) hypertension Wound Cleansing Wound #1 Right Toe Second o May Shower, gently pat wound dry prior to applying new dressing. Anesthetic (add to Medication List) Wound #1 Right Toe Second o Topical Lidocaine 4% cream applied to wound bed prior to debridement (In Clinic Only). Primary Wound Dressing Wound #1 Right Toe Second o Silver Alginate - nystatin  powder Dressing Change Frequency o Change dressing every day. Follow-up Appointments Wound #1 Right Toe Second o Return Appointment in 1 week. Medications-please add to medication list. o Other: - Prescription of Nystatin Electronic Signature(s) Signed: 12/06/2018 4:45:46 PM By: Elliot Gurney, BSN, RN, CWS, Kim RN, BSN Signed: 12/10/2018 9:05:33 PM By: Lenda Kelp PA-C Entered By: Elliot Gurney BSN, RN, CWS, Kim on 12/06/2018 16:24:32 Jose Day, Jose Day ([id number]) -------------------------------------------------------------------------------- Problem List Details Patient Name: Jose Ro Date of Service: 12/06/2018 3:45 PM Medical Record Number: [id number] Patient Account Number: [id number] Date of Birth/Sex: [birth year] (85 y.o. M) Treating RN: Huel Coventry Primary Care Provider: Magdalene Patricia Other Clinician: Referring Provider: Magdalene Patricia Treating Provider/Extender: Linwood Dibbles, HOYT Weeks in Treatment: 1 Active Problems ICD-10 Evaluated Encounter Code Description Active Date Today Diagnosis E11.621 Type 2 diabetes mellitus with foot ulcer 11/29/2018 No Yes L97.512 Non-pressure chronic ulcer of other part of right foot with fat 11/29/2018 No Yes layer exposed I10 Essential (primary) hypertension 11/29/2018 No Yes Inactive Problems Resolved Problems Electronic Signature(s) Signed: 12/10/2018 9:05:33 PM By: Lenda Kelp PA-C Entered By: Lenda Kelp on 12/06/2018 16:13:14 Jose Redmond, Para March ([id number]) -------------------------------------------------------------------------------- Progress Note Details Patient Name: Jose Ro Date of Service: 12/06/2018 3:45 PM Medical Record Number: [id number] Patient Account Number: [id number] Date of Birth/Sex: 1928/02/13 (85 y.o. M) Treating RN: Huel Coventry Primary Care Provider: Magdalene Patricia Other Clinician: Referring Provider: Magdalene Patricia Treating Provider/Extender: Linwood Dibbles,  HOYT Weeks in Treatment: 1 Subjective Chief Complaint Information obtained from Patient Right 2nd toe ulcer History of Present Illness (HPI) 11/29/18 on evaluation today patient presents for initial inspection of clinic concerning an issue that he is been having with his right second toe his daughter tells me since around July 2019. This has been an intermittent issue which apparently has opened and closed since that time. Initially he was utilizing me Pearson subsequently he is now utilizing Neosporin. Nonetheless the issue has been that the area continues to stay wet and it times will become "purplish in color  and infected". He has been on antibiotics, doxycycline, multiple times during the course of treatment by urgent care as well is his primary care provider. Unfortunately this never seems to close and stay closed. The patient has had recent blood work performed on November 23, 2018 where he had what appeared to be an excellent profile with a normal CMP, normal lipid panel, and is hemoglobin A1c was 5.8 and excellent. He is obviously a very well controlled diabetic. Other than this he also has hypertension. Otherwise he is a fairly healthy individual it appears. The main issue right now is just getting this wound to close. There have not been any x-rays nor MRIs of his foot taking up to this point. No cultures have been attained as best I can tell from history. His daughter is present and functions as an interpreter as well as the patient can speak some English but is definitely not fluent. Her help is appreciated. 12/06/18 on evaluation today patient appears to be doing better in regard to the ulcer on the right medial second toe. This is a very small area and upon evaluation today I'm not even sure that this isn't completely healed at this point. It's definitely doing better. There does not appear to be any signs of infection at this time. Do believe that nystatin has been of benefit for  him along with the alternate to keep everything nice and dry. His daughter is likewise pleased Patient History Information obtained from Patient. Family History No family history of Cancer, Diabetes, Heart Disease, Hereditary Spherocytosis, Hypertension, Kidney Disease, Lung Disease, Seizures, Stroke, Thyroid Problems, Tuberculosis. Social History Former smoker - quit many years ago, Marital Status - Married, Alcohol Use - Never, Drug Use - No History, Caffeine Use - Rarely. Medical History Eyes Patient has history of Cataracts - removed Denies history of Glaucoma, Optic Neuritis Ear/Nose/Mouth/Throat Denies history of Chronic sinus problems/congestion, Middle ear problems Hematologic/Lymphatic Denies history of Anemia, Hemophilia, Human Immunodeficiency Virus, Lymphedema, Sickle Cell Disease Respiratory Denies history of Aspiration, Asthma, Chronic Obstructive Pulmonary Disease (COPD), Pneumothorax, Sleep Apnea, Tuberculosis Jose Day, Jose Day ([id number]) Cardiovascular Patient has history of Hypertension Denies history of Angina, Arrhythmia, Congestive Heart Failure, Coronary Artery Disease, Deep Vein Thrombosis, Hypotension, Myocardial Infarction, Peripheral Arterial Disease, Peripheral Venous Disease, Phlebitis, Vasculitis Gastrointestinal Denies history of Cirrhosis , Colitis, Crohn s, Hepatitis A, Hepatitis B, Hepatitis C Endocrine Patient has history of Type II Diabetes Denies history of Type I Diabetes Genitourinary Denies history of End Stage Renal Disease Immunological Denies history of Lupus Erythematosus, Raynaud s, Scleroderma Integumentary (Skin) Denies history of History of Burn, History of pressure wounds Musculoskeletal Patient has history of Osteoarthritis Denies history of Gout, Rheumatoid Arthritis, Osteomyelitis Neurologic Denies history of Dementia, Neuropathy, Quadriplegia, Paraplegia, Seizure Disorder Oncologic Denies history of Received  Chemotherapy, Received Radiation Psychiatric Denies history of Anorexia/bulimia, Confinement Anxiety Review of Systems (ROS) Constitutional Symptoms (General Health) Denies complaints or symptoms of Fever, Chills. Respiratory The patient has no complaints or symptoms. Cardiovascular The patient has no complaints or symptoms. Psychiatric The patient has no complaints or symptoms. Objective Constitutional Well-nourished and well-hydrated in no acute distress. Vitals Time Taken: 3:47 PM, Height: 60 in, Weight: 131.9 lbs, BMI: 25.8, Temperature: 98.1 F, Pulse: 78 bpm, Respiratory Rate: 16 breaths/min, Blood Pressure: 128/80 mmHg. Respiratory normal breathing without difficulty. clear to auscultation bilaterally. Cardiovascular regular rate and rhythm with normal S1, S2. Psychiatric Jose Day, Cregg ([id number]) this patient is able to make decisions and demonstrates good insight into disease process.  Alert and Oriented x 3. pleasant and cooperative. General Notes: On evaluation today patient's wound did not require any sharp debridement in fact this just appears to be a very small opening if it's even truly open at all. It definitely appears to be doing much better which is excellent news. Overall I'm very pleased with the progress that he has made. The patient doesn't seem to be having any pain as he was in the beginning. This is likewise good news. Integumentary (Hair, Skin) Wound #1 status is Open. Original cause of wound was Gradually Appeared. The wound is located on the Right Toe Second. The wound measures 0.1cm length x 0.1cm width x 0.1cm depth; 0.008cm^2 area and 0.001cm^3 volume. There is Fat Layer (Subcutaneous Tissue) Exposed exposed. There is no tunneling or undermining noted. There is a none present amount of drainage noted. The wound margin is flat and intact. There is no granulation within the wound bed. There is no necrotic tissue within the wound bed. The  periwound skin appearance did not exhibit: Callus, Crepitus, Excoriation, Induration, Rash, Scarring, Dry/Scaly, Maceration, Atrophie Blanche, Cyanosis, Ecchymosis, Hemosiderin Staining, Mottled, Pallor, Rubor, Erythema. Periwound temperature was noted as No Abnormality. Assessment Active Problems ICD-10 Type 2 diabetes mellitus with foot ulcer Non-pressure chronic ulcer of other part of right foot with fat layer exposed Essential (primary) hypertension Plan Wound Cleansing: Wound #1 Right Toe Second: May Shower, gently pat wound dry prior to applying new dressing. Anesthetic (add to Medication List): Wound #1 Right Toe Second: Topical Lidocaine 4% cream applied to wound bed prior to debridement (In Clinic Only). Primary Wound Dressing: Wound #1 Right Toe Second: Silver Alginate - nystatin powder Dressing Change Frequency: Change dressing every day. Follow-up Appointments: Wound #1 Right Toe Second: Return Appointment in 1 week. Medications-please add to medication list.: Other: - Prescription of Nystatin Jose Day, Brysin ([id number]) At this point my suggestion is gonna be that we continue with the above wound care measures for the next week. The patient is in agreement with the plan. If anything changes or worsens in the meantime he will contact the office and let me know. Please see above for specific wound care orders. We will see patient for re-evaluation in 1 week(s) here in the clinic. If anything worsens or changes patient will contact our office for additional recommendations. if everything seems to be doing as well next week as it is today he will likely be healed. Electronic Signature(s) Signed: 12/10/2018 9:05:33 PM By: Lenda KelpStone III, Hoyt PA-C Entered By: Lenda KelpStone III, Hoyt on 12/10/2018 21:04:31 [address]ODRIGUEZ Dwana MelenaEGRON, Teague ([id number]) -------------------------------------------------------------------------------- ROS/PFSH Details Patient Name: Jose Day,  Hersel Date of Service: 12/06/2018 3:45 PM Medical Record Number: [id number] Patient Account Number: [id number] Date of Birth/Sex: Dec 21, 1927 (85 y.o. M) Treating RN: Huel CoventryWoody, Kim Primary Care Provider: Magdalene PatriciaEVELO, ADRIAN Other Clinician: Referring Provider: Magdalene PatriciaEVELO, ADRIAN Treating Provider/Extender: Linwood DibblesSTONE III, HOYT Weeks in Treatment: 1 Information Obtained From Patient Wound History Do you currently have one or more open woundso Yes How many open wounds do you currently haveo 1 Approximately how long have you had your woundso 4 months How have you been treating your wound(s) until nowo neosporin Has your wound(s) ever healed and then re-openedo No Have you had any lab work done in the past montho Yes Who ordered the lab work doneo PCP Have you tested positive for an antibiotic resistant organism (MRSA, VRE)o No Have you tested positive for osteomyelitis (bone infection)o No Have you had any tests for circulation on your legso No  Constitutional Symptoms (General Health) Complaints and Symptoms: Negative for: Fever; Chills Eyes Medical History: Positive for: Cataracts - removed Negative for: Glaucoma; Optic Neuritis Ear/Nose/Mouth/Throat Medical History: Negative for: Chronic sinus problems/congestion; Middle ear problems Hematologic/Lymphatic Medical History: Negative for: Anemia; Hemophilia; Human Immunodeficiency Virus; Lymphedema; Sickle Cell Disease Respiratory Complaints and Symptoms: No Complaints or Symptoms Medical History: Negative for: Aspiration; Asthma; Chronic Obstructive Pulmonary Disease (COPD); Pneumothorax; Sleep Apnea; Tuberculosis Cardiovascular Complaints and Symptoms: No Complaints or Symptoms QUINELL, SIWICKI ([id number]) Medical History: Positive for: Hypertension Negative for: Angina; Arrhythmia; Congestive Heart Failure; Coronary Artery Disease; Deep Vein Thrombosis; Hypotension; Myocardial Infarction; Peripheral Arterial Disease;  Peripheral Venous Disease; Phlebitis; Vasculitis Gastrointestinal Medical History: Negative for: Cirrhosis ; Colitis; Crohnos; Hepatitis A; Hepatitis B; Hepatitis C Endocrine Medical History: Positive for: Type II Diabetes Negative for: Type I Diabetes Treated with: Diet Blood sugar tested every day: No Genitourinary Medical History: Negative for: End Stage Renal Disease Immunological Medical History: Negative for: Lupus Erythematosus; Raynaudos; Scleroderma Integumentary (Skin) Medical History: Negative for: History of Burn; History of pressure wounds Musculoskeletal Medical History: Positive for: Osteoarthritis Negative for: Gout; Rheumatoid Arthritis; Osteomyelitis Neurologic Medical History: Negative for: Dementia; Neuropathy; Quadriplegia; Paraplegia; Seizure Disorder Oncologic Medical History: Negative for: Received Chemotherapy; Received Radiation Psychiatric Complaints and Symptoms: No Complaints or Symptoms Medical History: Negative for: Anorexia/bulimia; Confinement Anxiety HBO Extended History Items HEYDEN, SVEUM ([id number]) Eyes: Cataracts Immunizations Pneumococcal Vaccine: Received Pneumococcal Vaccination: Yes Immunization Notes: up to date Implantable Devices Family and Social History Cancer: No; Diabetes: No; Heart Disease: No; Hereditary Spherocytosis: No; Hypertension: No; Kidney Disease: No; Lung Disease: No; Seizures: No; Stroke: No; Thyroid Problems: No; Tuberculosis: No; Former smoker - quit many years ago; Marital Status - Married; Alcohol Use: Never; Drug Use: No History; Caffeine Use: Rarely; Financial Concerns: No; Food, Clothing or Shelter Needs: No; Support System Lacking: No; Transportation Concerns: No; Advanced Directives: Yes (Not Provided); Patient does not want information on Advanced Directives Physician Affirmation I have reviewed and agree with the above information. Electronic Signature(s) Signed: 12/10/2018  9:05:33 PM By: Lenda Kelp PA-C Signed: 12/30/2018 7:49:08 AM By: Elliot Gurney, BSN, RN, CWS, Kim RN, BSN Entered By: Lenda Kelp on 12/10/2018 [address] TYVAN, HOCKLEY ([id number]) -------------------------------------------------------------------------------- SuperBill Details Patient Name: Jose Ro Date of Service: 12/06/2018 Medical Record Number: [id number] Patient Account Number: [id number] Date of Birth/Sex: January 10, 1928 (85 y.o. M) Treating RN: Huel Coventry Primary Care Provider: Magdalene Patricia Other Clinician: Referring Provider: Magdalene Patricia Treating Provider/Extender: Linwood Dibbles, HOYT Weeks in Treatment: 1 Diagnosis Coding ICD-10 Codes Code Description E11.621 Type 2 diabetes mellitus with foot ulcer L97.512 Non-pressure chronic ulcer of other part of right foot with fat layer exposed I10 Essential (primary) hypertension Facility Procedures CPT4 Code: [id number] Description: [id number] - WOUND CARE VISIT-LEV 2 EST PT Modifier: Quantity: 1 Physician Procedures CPT4 Code: [id number] Description: 99214 - WC PHYS LEVEL 4 - EST PT ICD-10 Diagnosis Description E11.621 Type 2 diabetes mellitus with foot ulcer L97.512 Non-pressure chronic ulcer of other part of right foot with fat I10 Essential (primary) hypertension Modifier: layer exposed Quantity: 1 Electronic Signature(s) Signed: 12/10/2018 9:05:33 PM By: Lenda Kelp PA-C Entered By: Lenda Kelp on 12/06/2018 23:52:57

## 2019-11-23 ENCOUNTER — Emergency Department: Payer: Medicare HMO

## 2019-11-23 ENCOUNTER — Encounter: Payer: Self-pay | Admitting: Emergency Medicine

## 2019-11-23 ENCOUNTER — Emergency Department
Admission: EM | Admit: 2019-11-23 | Discharge: 2019-11-23 | Disposition: A | Discharge status: Home / Self Care | Payer: Medicare HMO | Attending: Emergency Medicine | Admitting: Emergency Medicine

## 2019-11-23 ENCOUNTER — Other Ambulatory Visit: Payer: Self-pay

## 2019-11-23 DIAGNOSIS — Y999 Unspecified external cause status: Secondary | ICD-10-CM | POA: Insufficient documentation

## 2019-11-23 DIAGNOSIS — E119 Type 2 diabetes mellitus without complications: Secondary | ICD-10-CM | POA: Insufficient documentation

## 2019-11-23 DIAGNOSIS — F17200 Nicotine dependence, unspecified, uncomplicated: Secondary | ICD-10-CM | POA: Insufficient documentation

## 2019-11-23 DIAGNOSIS — I10 Essential (primary) hypertension: Secondary | ICD-10-CM | POA: Diagnosis not present

## 2019-11-23 DIAGNOSIS — S8002XA Contusion of left knee, initial encounter: Secondary | ICD-10-CM | POA: Insufficient documentation

## 2019-11-23 DIAGNOSIS — S8992XA Unspecified injury of left lower leg, initial encounter: Secondary | ICD-10-CM | POA: Diagnosis present

## 2019-11-23 DIAGNOSIS — W010XXA Fall on same level from slipping, tripping and stumbling without subsequent striking against object, initial encounter: Secondary | ICD-10-CM | POA: Insufficient documentation

## 2019-11-23 DIAGNOSIS — Y929 Unspecified place or not applicable: Secondary | ICD-10-CM | POA: Diagnosis not present

## 2019-11-23 DIAGNOSIS — Y939 Activity, unspecified: Secondary | ICD-10-CM | POA: Diagnosis not present

## 2019-11-23 HISTORY — DX: Essential (primary) hypertension: I10

## 2019-11-23 HISTORY — DX: Type 2 diabetes mellitus without complications: E11.9

## 2019-11-23 MED ORDER — ACETAMINOPHEN 325 MG PO TABS
650.0000 mg | ORAL_TABLET | Freq: Once | ORAL | Status: AC
  Administered 2019-11-23: 13:00:00 650 mg via ORAL
  Filled 2019-11-23: qty 2

## 2019-11-23 MED ORDER — LIDOCAINE 5 % EX PTCH
1.0000 | MEDICATED_PATCH | CUTANEOUS | Status: DC
  Filled 2019-11-23: qty 1

## 2019-11-23 NOTE — ED Notes (Signed)
Pt and pt wife verbalized understanding of d/c instructions with interpreter use.

## 2019-11-23 NOTE — ED Triage Notes (Signed)
Pt in via EMS from [facility] with c/o fall. EMS reports pt fell on his wife trying to help her up. Pt with c/o pain to left knee. Pt ambulatory.

## 2019-11-23 NOTE — ED Triage Notes (Signed)
Larey Seat onto wife when he tried to get her up.  Left knee injury

## 2019-11-23 NOTE — Discharge Instructions (Addendum)
Follow discharge care instruction take NSAIDs and Tylenol as needed for pain/swelling.

## 2019-11-23 NOTE — ED Provider Notes (Addendum)
[facility] Emergency Department Provider Note   ____________________________________________   First MD Initiated Contact with Patient 11/23/19 1131     (approximate)  I have reviewed the triage vital signs and the nursing notes.   HISTORY Via interpreter Chief Complaint Fall    HPI Jose Day is a 86 y.o. male patient complain of left knee pain secondary to a fall.  Patient said his wife fell when he tried to pick her up he fell on top of her but his knee struck the floor.  Patient is able to bear weight with pain.  Patient denies swelling or loss of sensation.  Patient rates the pain as a 3/10.  Patient described the pain as "achy".  No palliative measures prior to arrival by EMS.         Past Medical History:  Diagnosis Date  . Diabetes mellitus without complication (HCC)   . Hypertension     There are no problems to display for this patient.   Past Surgical History:  Procedure Laterality Date  . EYE SURGERY      Prior to Admission medications   Not on File    Allergies Patient has no known allergies.  No family history on file.  Social History Social History   Tobacco Use  . Smoking status: Current Some Day Smoker  . Smokeless tobacco: Never Used  Substance Use Topics  . Alcohol use: Never  . Drug use: Not on file    Review of Systems Constitutional: No fever/chills Eyes: No visual changes. ENT: No sore throat. Cardiovascular: Denies chest pain. Respiratory: Denies shortness of breath. Gastrointestinal: No abdominal pain.  No nausea, no vomiting.  No diarrhea.  No constipation. Genitourinary: Negative for dysuria. Musculoskeletal: Left knee pain. Skin: Negative for rash. Neurological: Negative for headaches, focal weakness or numbness. Endocrine:  Diabetes and hypertension.  ____________________________________________   PHYSICAL EXAM:  VITAL SIGNS: ED Triage Vitals  Enc Vitals Group     BP  11/23/19 1102 138/60     Pulse Rate 11/23/19 1102 61     Resp 11/23/19 1102 18     Temp 11/23/19 1102 97.9 F (36.6 C)     Temp Source 11/23/19 1102 Oral     SpO2 11/23/19 1102 100 %     Weight 11/23/19 1102 135 lb (61.2 kg)     Height 11/23/19 1102 5\' 5"  (1.651 m)     Head Circumference --      Peak Flow --      Pain Score 11/23/19 1124 3     Pain Loc --      Pain Edu? --      Excl. in GC? --    Constitutional: Alert and oriented. Well appearing and in no acute distress. Neck: No stridor.  No cervical spine tenderness to palpation. Hematological/Lymphatic/Immunilogical: No cervical lymphadenopathy. Cardiovascular: Normal rate, regular rhythm. Grossly normal heart sounds.  Good peripheral circulation. Respiratory: Normal respiratory effort.  No retractions. Lungs CTAB. Gastrointestinal: Soft and nontender. No distention. No abdominal bruits. No CVA tenderness. Musculoskeletal: No obvious deformity to the left knee.  Patient is moderate guarding palpation of the left lateral patella.  Patient has full neck range of motion.   No joint effusions. Neurologic:  Normal speech and language. No gross focal neurologic deficits are appreciated. No gait instability. Skin:  Skin is warm, dry and intact. No rash noted.  No abrasion or ecchymosis. Psychiatric: Mood and affect are normal. Speech and behavior are normal.  ____________________________________________   LABS (all labs ordered are listed, but only abnormal results are displayed)  Labs Reviewed - No data to display ____________________________________________  EKG   ____________________________________________  RADIOLOGY  ED MD interpretation:    Official radiology report(s): DG Knee Complete 4 Views Left  Result Date: 11/23/2019 CLINICAL DATA:  Status post fall, pain EXAM: LEFT KNEE - COMPLETE 4+ VIEW COMPARISON:  None. FINDINGS: Generalized osteopenia. No acute fracture or dislocation. No aggressive osseous lesion.  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 No joint effusion. Peripheral vascular atherosclerotic disease. IMPRESSION: 1.  No acute osseous injury of the left knee. Electronically Signed   By: Kathreen Devoid   On: 11/23/2019 12:46    ____________________________________________   PROCEDURES  Procedure(s) performed (including Critical Care):  Procedures   ____________________________________________   INITIAL IMPRESSION / ASSESSMENT AND PLAN / ED COURSE  As part of my medical decision making, I reviewed the following data within the electronic MEDICAL RECORD NUMBER     Left knee pain secondary to fall and contusion.  Discussed negative x-ray findings with patient.  Patient given discharge care instructions.  Patient advised to wear the Lidoderm patch for 12 hours.  Patient may take extra strength Tylenol every 6 hours for 2 days as needed for inflammation/pain.  Advise follow-up with PCP.    Jose Day was evaluated in Emergency Department on 11/23/2019 for the symptoms described in the history of present illness. He was evaluated in the context of the global COVID-19 pandemic, which necessitated consideration that the patient might be at risk for infection with the SARS-CoV-2 virus that causes COVID-19. Institutional protocols and algorithms that pertain to the evaluation of patients at risk for COVID-19 are in a state of rapid change based on information released by regulatory bodies including the CDC and federal and state organizations. These policies and algorithms were followed during the patient's care in the ED.       ____________________________________________   FINAL CLINICAL IMPRESSION(S) / ED DIAGNOSES  Final diagnoses:  Contusion of left knee, initial encounter     ED Discharge Orders    None       Note:  This document was prepared using Dragon voice recognition software and may include unintentional  dictation errors.    Sable Feil, PA-C 11/23/19 1253    Sable Feil, PA-C 11/23/19 1333    Arta Silence, MD 11/23/19 1346

## 2020-02-09 ENCOUNTER — Ambulatory Visit: Payer: Medicare HMO | Admitting: Student in an Organized Health Care Education/Training Program

## 2020-05-24 ENCOUNTER — Ambulatory Visit: Payer: Medicare HMO | Admitting: Student in an Organized Health Care Education/Training Program

## 2020-11-25 ENCOUNTER — Other Ambulatory Visit: Payer: Self-pay

## 2020-11-25 ENCOUNTER — Emergency Department
Admission: EM | Admit: 2020-11-25 | Discharge: 2020-11-26 | Disposition: A | Discharge status: Home / Self Care | Payer: Medicare (Managed Care) | Attending: Emergency Medicine | Admitting: Emergency Medicine

## 2020-11-25 ENCOUNTER — Emergency Department: Payer: Medicare (Managed Care)

## 2020-11-25 DIAGNOSIS — I10 Essential (primary) hypertension: Secondary | ICD-10-CM | POA: Insufficient documentation

## 2020-11-25 DIAGNOSIS — F17200 Nicotine dependence, unspecified, uncomplicated: Secondary | ICD-10-CM | POA: Diagnosis not present

## 2020-11-25 DIAGNOSIS — W010XXA Fall on same level from slipping, tripping and stumbling without subsequent striking against object, initial encounter: Secondary | ICD-10-CM | POA: Insufficient documentation

## 2020-11-25 DIAGNOSIS — E119 Type 2 diabetes mellitus without complications: Secondary | ICD-10-CM | POA: Diagnosis not present

## 2020-11-25 DIAGNOSIS — F0390 Unspecified dementia without behavioral disturbance: Secondary | ICD-10-CM | POA: Insufficient documentation

## 2020-11-25 DIAGNOSIS — W19XXXA Unspecified fall, initial encounter: Secondary | ICD-10-CM

## 2020-11-25 DIAGNOSIS — S0990XA Unspecified injury of head, initial encounter: Secondary | ICD-10-CM | POA: Insufficient documentation

## 2020-11-25 LAB — BASIC METABOLIC PANEL
Anion gap: 10 (ref 5–15)
BUN: 28 mg/dL — ABNORMAL HIGH (ref 8–23)
CO2: 29 mmol/L (ref 22–32)
Calcium: 9.4 mg/dL (ref 8.9–10.3)
Chloride: 102 mmol/L (ref 98–111)
Creatinine, Ser: 1.14 mg/dL (ref 0.61–1.24)
GFR, Estimated: >60 mL/min (ref >60)
Glucose, Bld: 114 mg/dL — ABNORMAL HIGH (ref 70–99)
Potassium: 4.1 mmol/L (ref 3.5–5.1)
Sodium: 141 mmol/L (ref 135–145)

## 2020-11-25 LAB — CBC
HCT: 43 % (ref 39.0–52.0)
Hemoglobin: 14.8 g/dL (ref 13.0–17.0)
MCH: 33.6 pg (ref 26.0–34.0)
MCHC: 34.4 g/dL (ref 30.0–36.0)
MCV: 97.7 fL (ref 80.0–100.0)
Platelets: 146 10*3/uL — ABNORMAL LOW (ref 150–400)
RBC: 4.4 MIL/uL (ref 4.22–5.81)
RDW: 13.2 % (ref 11.5–15.5)
WBC: 5 10*3/uL (ref 4.0–10.5)
nRBC: 0 % (ref 0.0–0.2)

## 2020-11-25 MED ORDER — LORAZEPAM 2 MG/ML IJ SOLN
1.0000 mg | Freq: Once | INTRAMUSCULAR | Status: AC
  Administered 2020-11-26: 1 mg via INTRAMUSCULAR
  Filled 2020-11-25: qty 1

## 2020-11-25 MED ORDER — LORAZEPAM 0.5 MG PO TABS
0.5000 mg | ORAL_TABLET | ORAL | Status: AC
  Administered 2020-11-25: 0.5 mg via ORAL
  Filled 2020-11-25: qty 1

## 2020-11-25 MED ORDER — TRAZODONE HCL 50 MG PO TABS
50.0000 mg | ORAL_TABLET | ORAL | Status: AC
  Administered 2020-11-25: 50 mg via ORAL
  Filled 2020-11-25: qty 1

## 2020-11-25 NOTE — ED Notes (Signed)
Daughter Jose Day @ [phone] said pt has dementia and is only Spanish speaking, please with update.

## 2020-11-25 NOTE — ED Provider Notes (Signed)
[facility] Emergency Department Provider Note   ____________________________________________   None    (approximate)  I have reviewed the triage vital signs and the nursing notes.   HISTORY  Chief Complaint Fall  EM caveat: Limited by severe dementia  HPI Jose Day is a 87 y.o. male no history provided through the patient's daughter whom I spoke with.  Patient had a fall today.  Evidently fell while getting  up, witnessed by patient's wife who reported to the daughter that he had tripped.  He did strike his head which prompted a call to 911.  There is no report of the patient being recently ill, he does have a history of frequent falls per the daughter, and no other injury was noted other than the fact that he struck his head.  He did not pass out or lose consciousness  Past Medical History:  Diagnosis Date  . Diabetes mellitus without complication (HCC)   . Hypertension     There are no problems to display for this patient.   Past Surgical History:  Procedure Laterality Date  . EYE SURGERY      Prior to Admission medications   Not on File    Allergies Patient has no known allergies.  No family history on file.  Social History Social History   Tobacco Use  . Smoking status: Current Some Day Smoker  . Smokeless tobacco: Never Used  Substance Use Topics  . Alcohol use: Never    Review of Systems  Attempted to use interpreter to communicate directly with patient, this was not able to be done.  I suspect that due to the patient's significant dementia use of video interpreter was not particularly helpful.  I did however gain history for the patient's daughter  Patient's daughter does report that he has not been recently ill aside from frequently falling.    ____________________________________________   PHYSICAL EXAM:  VITAL SIGNS: ED Triage Vitals  Enc Vitals Group     BP 11/25/20 1807 (!) 180/90      Pulse Rate 11/25/20 1807 72     Resp 11/25/20 1807 16     Temp 11/25/20 1807 98 F (36.7 C)     Temp Source 11/25/20 1807 Oral     SpO2 11/25/20 1807 93 %     Weight 11/25/20 1808 134 lb 14.7 oz (61.2 kg)     Height 11/25/20 1808 5\' 5"  (1.651 m)     Head Circumference --      Peak Flow --      Pain Score 11/25/20 1808 0     Pain Loc --      Pain Edu? --      Excl. in GC? --     Constitutional: Alert and disoriented.  Knows his own name Eyes: Conjunctivae are normal. Head: Atraumatic except for perhaps some very slight scalp edema but no  hematomas or lacerations. Nose: No congestion/rhinnorhea. Mouth/Throat: Mucous membranes are moist. Neck: No stridor.  Noted normal mobility. Cardiovascular: Good peripheral circulation. Respiratory: Normal respiratory effort.  No retractions.  No wheezing. Musculoskeletal: No lower extremity tenderness nor edema.  Patient walks with normal gait.  Moves all extremities without pain or discomfort. Neurologic:   No gross focal neurologic deficits are appreciated.  Skin:  Skin is warm, dry and intact. No rash noted. Psychiatric: Mood and affect are slightly agitated, purposefully doing things but seems disoriented but is redirectable.  He will attempt to walk about sit down use  recliner.  ____________________________________________   LABS (all labs ordered are listed, but only abnormal results are displayed)  Labs Reviewed  CBC - Abnormal; Notable for the following components:      Result Value   Platelets 146 (*)    All other components within normal limits  BASIC METABOLIC PANEL - Abnormal; Notable for the following components:   Glucose, Bld 114 (*)    BUN 28 (*)    All other components within normal limits   ____________________________________________  EKG   ____________________________________________  RADIOLOGY  CT Head Wo Contrast  Result Date: 11/25/2020 CLINICAL DATA:  Status post fall.  knot to posterior head. EXAM: CT  HEAD WITHOUT CONTRAST CT CERVICAL SPINE WITHOUT CONTRAST TECHNIQUE: Multidetector CT imaging of the head and cervical spine was performed following the standard protocol without intravenous contrast. Multiplanar CT image reconstructions of the cervical spine were also generated. COMPARISON:  None. FINDINGS: CT HEAD FINDINGS Brain: Cerebral ventricle sizes are concordant with the degree of cerebral volume loss. Patchy and confluent areas of decreased attenuation are noted throughout the deep and periventricular white matter of the cerebral hemispheres bilaterally, compatible with chronic microvascular ischemic disease. No evidence of large-territorial acute infarction. No parenchymal hemorrhage. No mass lesion. No extra-axial collection. No mass effect or midline shift. No hydrocephalus. Basilar cisterns are patent. Vascular: No hyperdense vessel. Atherosclerotic calcifications are present within the cavernous internal carotid and vertebral arteries. Skull: No acute fracture or focal lesion. Old right zygomatic fracture. Sinuses/Orbits: Paranasal sinuses and mastoid air cells are clear. Bilateral lens replacement. Otherwise orbits are unremarkable. Other: Mild scalp edema with no definite hematoma formation. CT CERVICAL SPINE FINDINGS Alignment: Normal. Skull base and vertebrae: No acute fracture. No aggressive appearing focal osseous lesion or focal pathologic process. Soft tissues and spinal canal: No prevertebral fluid or swelling. No visible canal hematoma. Disc levels:  Maintained. Upper chest: Unremarkable. Other: At least mild carotid artery calcifications within the neck. IMPRESSION: 1. No acute intracranial abnormality. 2. No acute displaced fracture or traumatic listhesis of the cervical spine. Electronically Signed   By: Tish Frederickson M.D.   On: 11/25/2020 18:40   CT Cervical Spine Wo Contrast  Result Date: 11/25/2020 CLINICAL DATA:  Status post fall.  knot to posterior head. EXAM: CT HEAD WITHOUT  CONTRAST CT CERVICAL SPINE WITHOUT CONTRAST TECHNIQUE: Multidetector CT imaging of the head and cervical spine was performed following the standard protocol without intravenous contrast. Multiplanar CT image reconstructions of the cervical spine were also generated. COMPARISON:  None. FINDINGS: CT HEAD FINDINGS Brain: Cerebral ventricle sizes are concordant with the degree of cerebral volume loss. Patchy and confluent areas of decreased attenuation are noted throughout the deep and periventricular white matter of the cerebral hemispheres bilaterally, compatible with chronic microvascular ischemic disease. No evidence of large-territorial acute infarction. No parenchymal hemorrhage. No mass lesion. No extra-axial collection. No mass effect or midline shift. No hydrocephalus. Basilar cisterns are patent. Vascular: No hyperdense vessel. Atherosclerotic calcifications are present within the cavernous internal carotid and vertebral arteries. Skull: No acute fracture or focal lesion. Old right zygomatic fracture. Sinuses/Orbits: Paranasal sinuses and mastoid air cells are clear. Bilateral lens replacement. Otherwise orbits are unremarkable. Other: Mild scalp edema with no definite hematoma formation. CT CERVICAL SPINE FINDINGS Alignment: Normal. Skull base and vertebrae: No acute fracture. No aggressive appearing focal osseous lesion or focal pathologic process. Soft tissues and spinal canal: No prevertebral fluid or swelling. No visible canal hematoma. Disc levels:  Maintained. Upper chest: Unremarkable. Other: At  least mild carotid artery calcifications within the neck. IMPRESSION: 1. No acute intracranial abnormality. 2. No acute displaced fracture or traumatic listhesis of the cervical spine. Electronically Signed   By: Tish Frederickson M.D.   On: 11/25/2020 18:40    CT head and cervical spine negative for acute findings.  Old right zygomatic  fracture. ____________________________________________   PROCEDURES  Procedure(s) performed: None  Procedures  Critical Care performed: No  ____________________________________________   INITIAL IMPRESSION / ASSESSMENT AND PLAN / ED COURSE  Pertinent labs & imaging results that were available during my care of the patient were reviewed by me and considered in my medical decision making (see chart for details).   Discussed with the patient's daughter, it seems the patient has a history of frequent falls in fact he was just evaluated a couple days ago in the ER for falling as well.  Today they called EMS as he fell and struck his head.  His work-up and imaging here is reassuring, and discussion with his daughter's reports he has very severe dementia.  I suspect that he is exhibiting some sundowning type behavior here in the ER, and will provide him his home dose of trazodone and a small dose of Ativan to help in calming.  He does have a one-to-one sitter, and is being monitored carefully for falls and safety.  No report of recent new illness.  Vital signs reassuring.  Afebrile.  I discussed with his daughter and he can return to his home she is very much active in caring for him as well as his wife.  Daughter reports though that due to the weather conditions tonight she cannot bring him home, and EMS is currently not providing convalescent services either.  Plan of care is to attempt to find a safe transportation to his home in the morning, social work team consulted and patient's daughter also reports that she will be happy to assist with this tomorrow    Ongoing care the patient was sent to Dr. York Cerise  ____________________________________________   FINAL CLINICAL IMPRESSION(S) / ED DIAGNOSES  Final diagnoses:  Fall, initial encounter  Closed head injury, initial encounter        Note:  This document was prepared using Conservation officer, historic buildings and may include  unintentional dictation errors       Sharyn Creamer, MD 11/25/20 2340

## 2020-11-25 NOTE — ED Notes (Signed)
Patient has continued to wander around the waiting room, gait unsteady with this RN close by for assistance.

## 2020-11-25 NOTE — ED Notes (Signed)
Patient remains unsteady on his feet and requires stand by assistance with ambulating.

## 2020-11-25 NOTE — ED Notes (Signed)
Called patient daughter to request help with patient due to his dementia and language barrier.  Patient has been attempting to get up and walk around.  [place] to [place] emergency room or to provide patient with transportation when discharged.

## 2020-11-25 NOTE — ED Triage Notes (Signed)
BIB [facility] from home after falling when getting out of bed. "knot" to posterior head reported by EMS. Alert and oriented X 2 at baseline per EMS. VSS.

## 2020-11-26 LAB — CBG MONITORING, ED: Glucose-Capillary: 87 mg/dL (ref 70–99)

## 2020-11-26 NOTE — ED Notes (Signed)
Per convo with EDP: pt appears sleeping att and relaxed will hold IM meds

## 2020-11-26 NOTE — ED Notes (Signed)
Patient cleaned with new sheets and brief. Patient still drowsy but arousable.

## 2020-11-26 NOTE — ED Notes (Signed)
Patient now awake. Daughter called for pickup. Daughter stated that she will be here soon for discharge.

## 2020-11-26 NOTE — ED Notes (Signed)
Patient's daughter signed paper copy for discharge.

## 2020-11-26 NOTE — ED Provider Notes (Signed)
-----------------------------------------   10:59 AM on 11/26/2020 -----------------------------------------  Reassuring work-up.  Lab work is largely normal.  CT scans are negative.  Daughter is here now.  Patient will be discharged home shortly.   Minna Antis, MD 11/26/20 1100

## 2020-11-26 NOTE — ED Notes (Signed)
Colin Mulders, RN spoke with pt's daughter. Pt on way to ED at this time

## 2020-11-26 NOTE — ED Notes (Signed)
Patient's daughter unable to stay any longer. Daughter, Hilbert Odor, phone number [phone] for when patient is awake and ready for discharge.

## 2020-11-26 NOTE — ED Notes (Signed)
Patient sleeping at this time. Daughter at bedside. When patient is more alert patient will be discharged.

## 2020-12-12 ENCOUNTER — Emergency Department: Payer: Medicare (Managed Care)

## 2020-12-12 ENCOUNTER — Emergency Department
Admission: EM | Admit: 2020-12-12 | Discharge: 2020-12-12 | Disposition: A | Discharge status: Home / Self Care | Payer: Medicare (Managed Care) | Attending: Emergency Medicine | Admitting: Emergency Medicine

## 2020-12-12 ENCOUNTER — Other Ambulatory Visit: Payer: Self-pay

## 2020-12-12 ENCOUNTER — Encounter: Payer: Self-pay | Admitting: Emergency Medicine

## 2020-12-12 DIAGNOSIS — E119 Type 2 diabetes mellitus without complications: Secondary | ICD-10-CM | POA: Diagnosis not present

## 2020-12-12 DIAGNOSIS — F0390 Unspecified dementia without behavioral disturbance: Secondary | ICD-10-CM | POA: Diagnosis not present

## 2020-12-12 DIAGNOSIS — R296 Repeated falls: Secondary | ICD-10-CM | POA: Insufficient documentation

## 2020-12-12 DIAGNOSIS — F17200 Nicotine dependence, unspecified, uncomplicated: Secondary | ICD-10-CM | POA: Diagnosis not present

## 2020-12-12 DIAGNOSIS — I10 Essential (primary) hypertension: Secondary | ICD-10-CM | POA: Insufficient documentation

## 2020-12-12 LAB — URINALYSIS, COMPLETE (UACMP) WITH MICROSCOPIC
Bacteria, UA: NONE SEEN
Bilirubin Urine: NEGATIVE
Glucose, UA: NEGATIVE mg/dL
Hgb urine dipstick: NEGATIVE
Ketones, ur: NEGATIVE mg/dL
Leukocytes,Ua: NEGATIVE
Nitrite: NEGATIVE
Protein, ur: NEGATIVE mg/dL
Specific Gravity, Urine: 1.012 (ref 1.005–1.030)
pH: 6 (ref 5.0–8.0)

## 2020-12-12 LAB — CBC WITH DIFFERENTIAL/PLATELET
Abs Immature Granulocytes: 0.01 10*3/uL (ref 0.00–0.07)
Basophils Absolute: 0 10*3/uL (ref 0.0–0.1)
Basophils Relative: 0 %
Eosinophils Absolute: 0 10*3/uL (ref 0.0–0.5)
Eosinophils Relative: 1 %
HCT: 40 % (ref 39.0–52.0)
Hemoglobin: 13.3 g/dL (ref 13.0–17.0)
Immature Granulocytes: 0 %
Lymphocytes Relative: 18 %
Lymphs Abs: 0.9 10*3/uL (ref 0.7–4.0)
MCH: 32.8 pg (ref 26.0–34.0)
MCHC: 33.3 g/dL (ref 30.0–36.0)
MCV: 98.5 fL (ref 80.0–100.0)
Monocytes Absolute: 0.3 10*3/uL (ref 0.1–1.0)
Monocytes Relative: 6 %
Neutro Abs: 3.8 10*3/uL (ref 1.7–7.7)
Neutrophils Relative %: 75 %
Platelets: 144 10*3/uL — ABNORMAL LOW (ref 150–400)
RBC: 4.06 MIL/uL — ABNORMAL LOW (ref 4.22–5.81)
RDW: 13.4 % (ref 11.5–15.5)
WBC: 5 10*3/uL (ref 4.0–10.5)
nRBC: 0 % (ref 0.0–0.2)

## 2020-12-12 LAB — COMPREHENSIVE METABOLIC PANEL
ALT: 11 U/L (ref 0–44)
AST: 20 U/L (ref 15–41)
Albumin: 4 g/dL (ref 3.5–5.0)
Alkaline Phosphatase: 73 U/L (ref 38–126)
Anion gap: 9 (ref 5–15)
BUN: 20 mg/dL (ref 8–23)
CO2: 30 mmol/L (ref 22–32)
Calcium: 9.2 mg/dL (ref 8.9–10.3)
Chloride: 102 mmol/L (ref 98–111)
Creatinine, Ser: 1.15 mg/dL (ref 0.61–1.24)
GFR, Estimated: >60 mL/min (ref >60)
Glucose, Bld: 128 mg/dL — ABNORMAL HIGH (ref 70–99)
Potassium: 4.1 mmol/L (ref 3.5–5.1)
Sodium: 141 mmol/L (ref 135–145)
Total Bilirubin: 0.6 mg/dL (ref 0.3–1.2)
Total Protein: 7.3 g/dL (ref 6.5–8.1)

## 2020-12-12 LAB — LIPASE, BLOOD: Lipase: 29 U/L (ref 11–51)

## 2020-12-12 NOTE — ED Notes (Addendum)
Pt with unwitnessed fall this morning, found at 0800. Hx dementia.  C/o generalized back pain, hx back pain before fall.  NAD noted  Daughter at bedside assisting to answer questions d/t pt poor historian

## 2020-12-12 NOTE — ED Notes (Signed)
Pt assisted with urinal

## 2020-12-12 NOTE — ED Provider Notes (Signed)
[facility] Emergency Department Provider Note   ____________________________________________   Event Date/Time   First MD Initiated Contact with Patient 12/12/20 [phone]     (approximate)  I have reviewed the triage vital signs and the nursing notes.   HISTORY  Chief Complaint Fall    HPI Jose Day is a 87 y.o. male with past medical history of hypertension, diabetes, and dementia who presents to the ED for fall.  History is limited due to patient's baseline dementia and language barrier, majority of history obtained using daughter as interpreter per family preference.  Daughter states that patient was found sitting on the ground beside of the bed around 8:00 this morning.  Patient's wife thought that he fell shortly before this and was not on the ground for very long.  Patient has been unable to articulate exactly how he fell or how long he was on the ground for.  He had complained of pain in his lower back as well as in his right hip with EMS, but now denies pain and states he feels well.  Daughter states he has not had any fever, cough, chest pain, shortness of breath, vomiting, diarrhea, or difficulty urinating.  She states he is at his baseline mental status.        Past Medical History:  Diagnosis Date  . Diabetes mellitus without complication (HCC)   . Hypertension     There are no problems to display for this patient.   Past Surgical History:  Procedure Laterality Date  . EYE SURGERY      Prior to Admission medications   Not on File    Allergies Patient has no known allergies.  History reviewed. No pertinent family history.  Social History Social History   Tobacco Use  . Smoking status: Current Some Day Smoker  . Smokeless tobacco: Never Used  Substance Use Topics  . Alcohol use: Never    Review of Systems Unable to obtain secondary to dementia  ____________________________________________   PHYSICAL  EXAM:  VITAL SIGNS: ED Triage Vitals  Enc Vitals Group     BP 12/12/20 0932 (!) 181/94     Pulse Rate 12/12/20 0932 78     Resp 12/12/20 0932 20     Temp 12/12/20 0932 97.8 F (36.6 C)     Temp Source 12/12/20 0932 Oral     SpO2 12/12/20 0932 99 %     Weight 12/12/20 0933 160 lb (72.6 kg)     Height 12/12/20 0933 5\' 7"  (1.702 m)     Head Circumference --      Peak Flow --      Pain Score --      Pain Loc --      Pain Edu? --      Excl. in GC? --     Constitutional: Alert and oriented to person and place, but not time. Eyes: Conjunctivae are normal.  Pupils equal round and reactive to light bilaterally. Head: Atraumatic. Nose: No congestion/rhinnorhea. Mouth/Throat: Mucous membranes are moist. Neck: Normal ROM, no midline cervical spine tenderness. Cardiovascular: Normal rate, regular rhythm. Grossly normal heart sounds. Respiratory: Normal respiratory effort.  No retractions. Lungs CTAB. Gastrointestinal: Soft and nontender. No distention. Genitourinary: deferred Musculoskeletal: No lower extremity tenderness nor edema.  No upper extremity bony tenderness, range of motion intact to upper extremities without pain.  No lower extremity bony tenderness or hip tenderness, range of motion intact without pain. Neurologic:  Normal speech and language. No gross  focal neurologic deficits are appreciated. Skin:  Skin is warm, dry and intact. No rash noted. Psychiatric: Mood and affect are normal. Speech and behavior are normal.  ____________________________________________   LABS (all labs ordered are listed, but only abnormal results are displayed)  Labs Reviewed  CBC WITH DIFFERENTIAL/PLATELET - Abnormal; Notable for the following components:      Result Value   RBC 4.06 (*)    Platelets 144 (*)    All other components within normal limits  COMPREHENSIVE METABOLIC PANEL - Abnormal; Notable for the following components:   Glucose, Bld 128 (*)    All other components within  normal limits  URINALYSIS, COMPLETE (UACMP) WITH MICROSCOPIC - Abnormal; Notable for the following components:   Color, Urine YELLOW (*)    APPearance CLEAR (*)    All other components within normal limits  LIPASE, BLOOD   ____________________________________________  EKG  ED ECG REPORT I, Chesley Noon, the attending physician, personally viewed and interpreted this ECG.   Date: 12/12/2020  EKG Time: 9:38  Rate: 77  Rhythm: normal sinus rhythm, occasional PVC  Axis: Normal  Intervals:none  ST&T Change: None   PROCEDURES  Procedure(s) performed (including Critical Care):  Procedures   ____________________________________________   INITIAL IMPRESSION / ASSESSMENT AND PLAN / ED COURSE       87 year old male with past medical history of hypertension, diabetes, dementia who presents to the ED following unwitnessed fall at home where he was found on the ground beside the bed.  He is not in any visible discomfort at this time, no tenderness noted along his extremities or spine.  He has no focal neurologic deficits and is at his baseline mental status.  Vital signs are reassuring and labs thus far unremarkable, UA is pending but thus far no evidence of infectious process.  CT head and C-spine are negative for acute process, x-rays of right hip reviewed by me and show no evidence of fracture or dislocation.  UA is unremarkable, patient continues to feel well and at his baseline mental status per daughter.  He is appropriate for discharge home [facility].  So far, they have been increasing frequency of assistance at home.  Daughter counseled to have patient return to the ED for new worsening symptoms, daughter agrees with plan.      ____________________________________________   FINAL CLINICAL IMPRESSION(S) / ED DIAGNOSES  Final diagnoses:  Frequent falls  Dementia without behavioral disturbance, unspecified dementia type [facility])      ED Discharge Orders    None       Note:  This document was prepared using Dragon voice recognition software and may include unintentional dictation errors.   Chesley Noon, MD 12/12/20 1101

## 2020-12-12 NOTE — ED Triage Notes (Signed)
Pt to ED via [facility] with c/o fall. Per EMS pt found sitting up against his bed, per EMS pt slid out of bed, unknown how long patient has been on the floor. Per EMS pt c/o R hip pain and R knee pain. Per EMS pt is a [facility] patient, and wants patient evaluated for R hip and knee pain, EMS reports R hip pain and R knee pain started prior to fall.   EMS reports patient lives at home with wife. Per previous triage note from 1/16 pt A&O x2 at baseline.   Lloyda, Interpreter in triage at this time.   Pt knows name, does not know birthday, does not know where he is, does not know the date, and does not remember what happened. Pt c/o pain to his head, both legs, R hip, and initially complains of abdominal pain however then denies abdominal pain.   Pt is poor historian at this time, unable to state sequence of events or recall what happened.

## 2020-12-12 NOTE — ED Notes (Signed)
Pt daughter verbalized understanding of d/c instructions. Pt discharged with daughter to home

## 2022-02-08 DEATH — deceased

## 2022-11-07 IMAGING — CR DG HIP (WITH OR WITHOUT PELVIS) 2-3V*R*
1 series · 3 of 3 positions shown · non-contrast
Comparison: None.

CLINICAL DATA: Fall, right hip pain

EXAM:
DG HIP (WITH OR WITHOUT PELVIS) 2-3V RIGHT

[Series 1: dg hip unilat w or w/o pelvis 2-3 views  · non-contrast · 0.14mm/px · 3 of 3 slices shown]
[im 1/3]
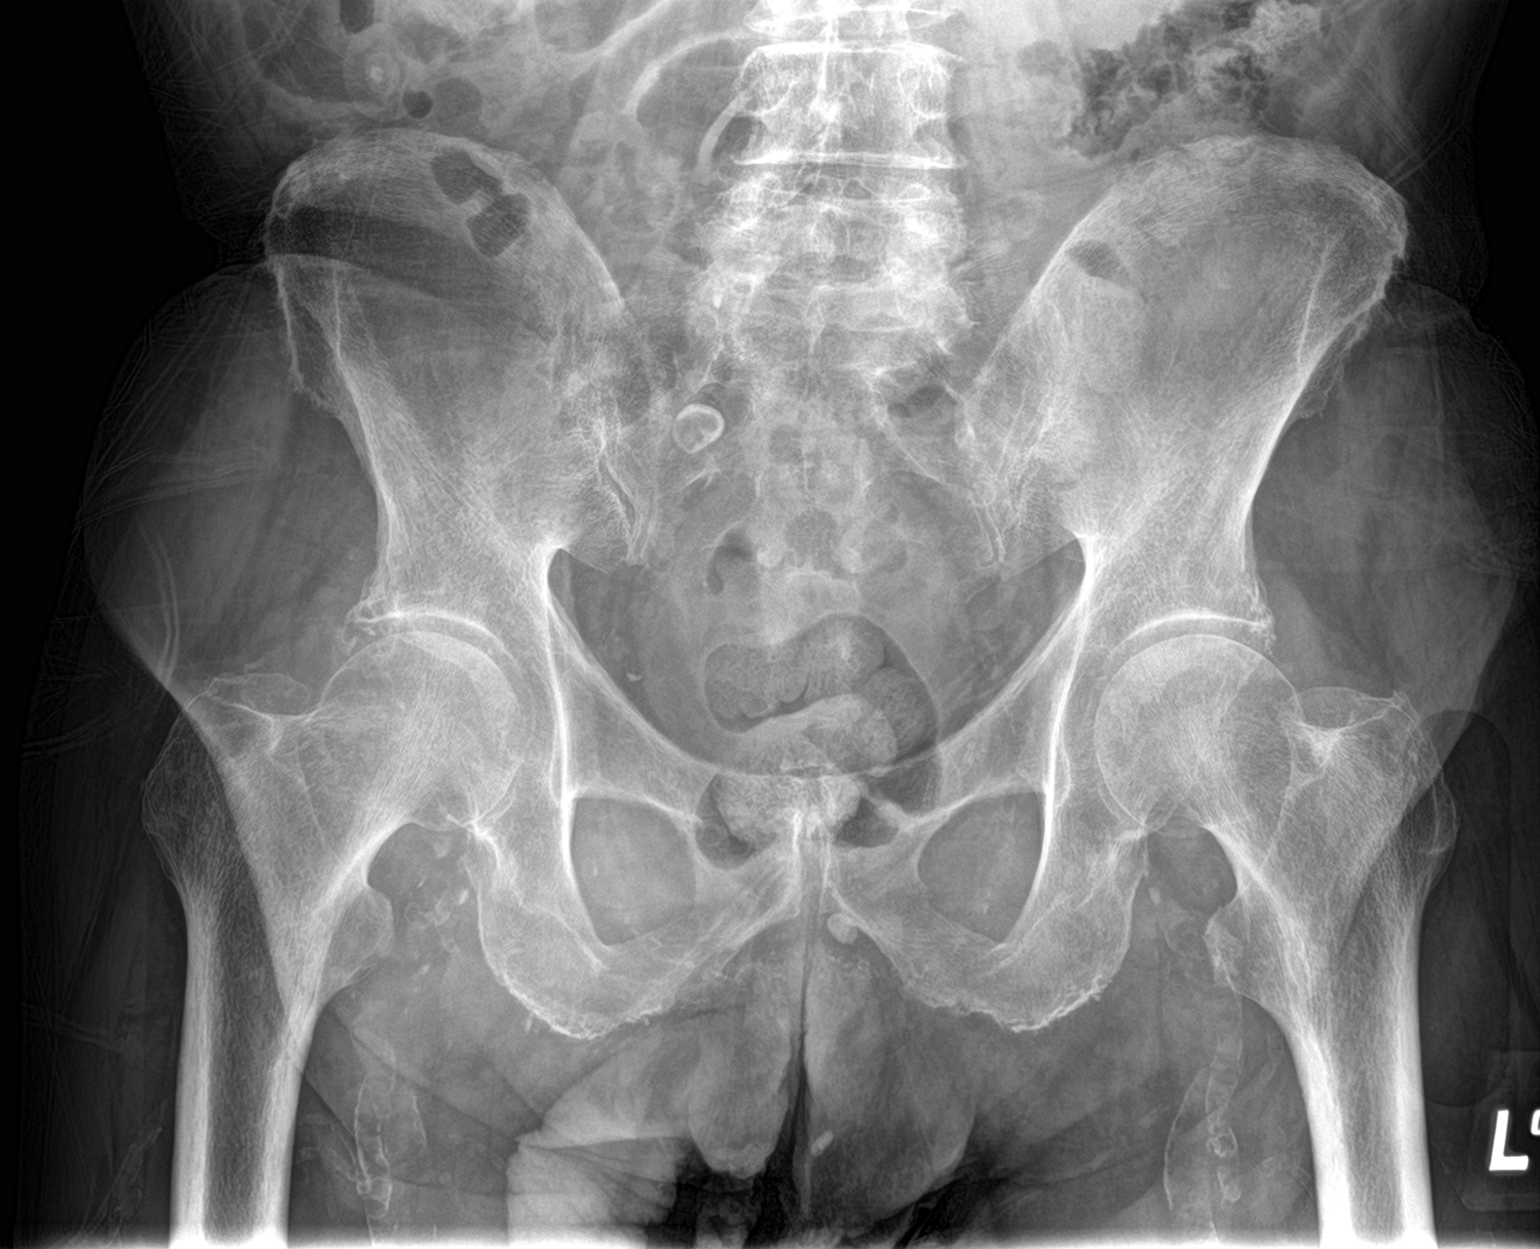
[im 2/3]
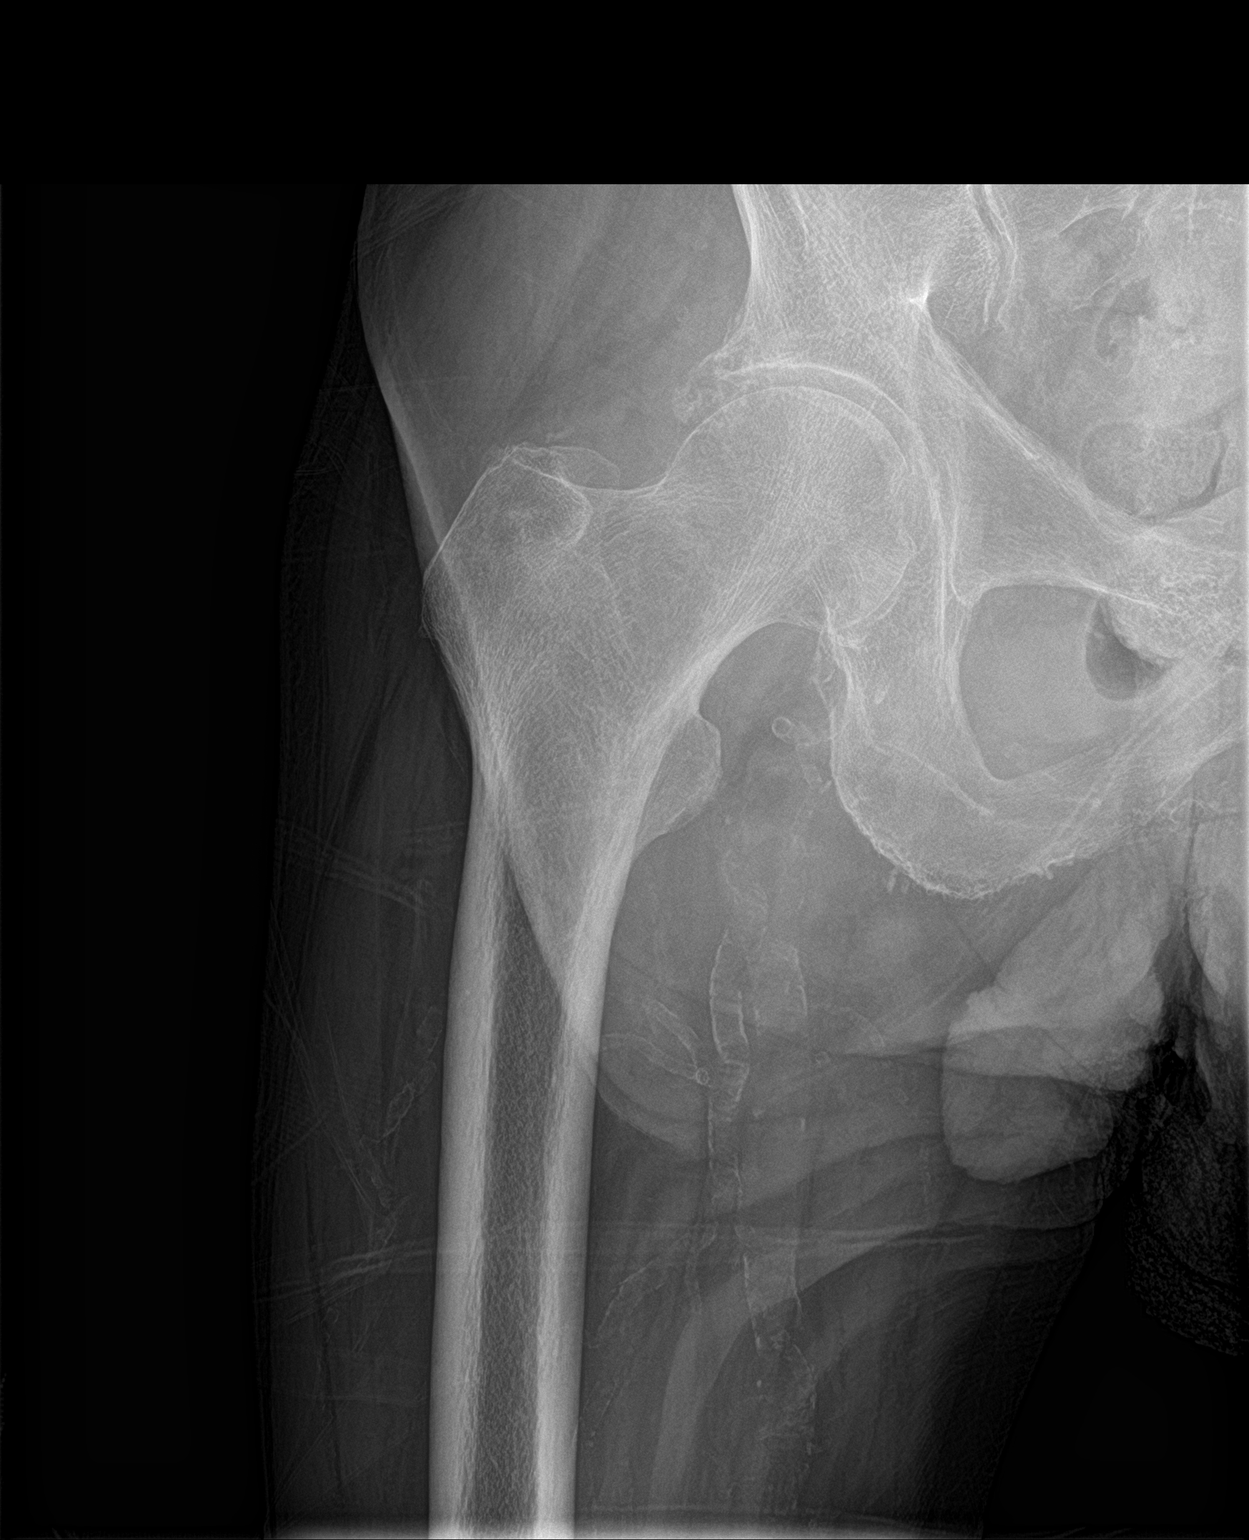
[im 3/3]
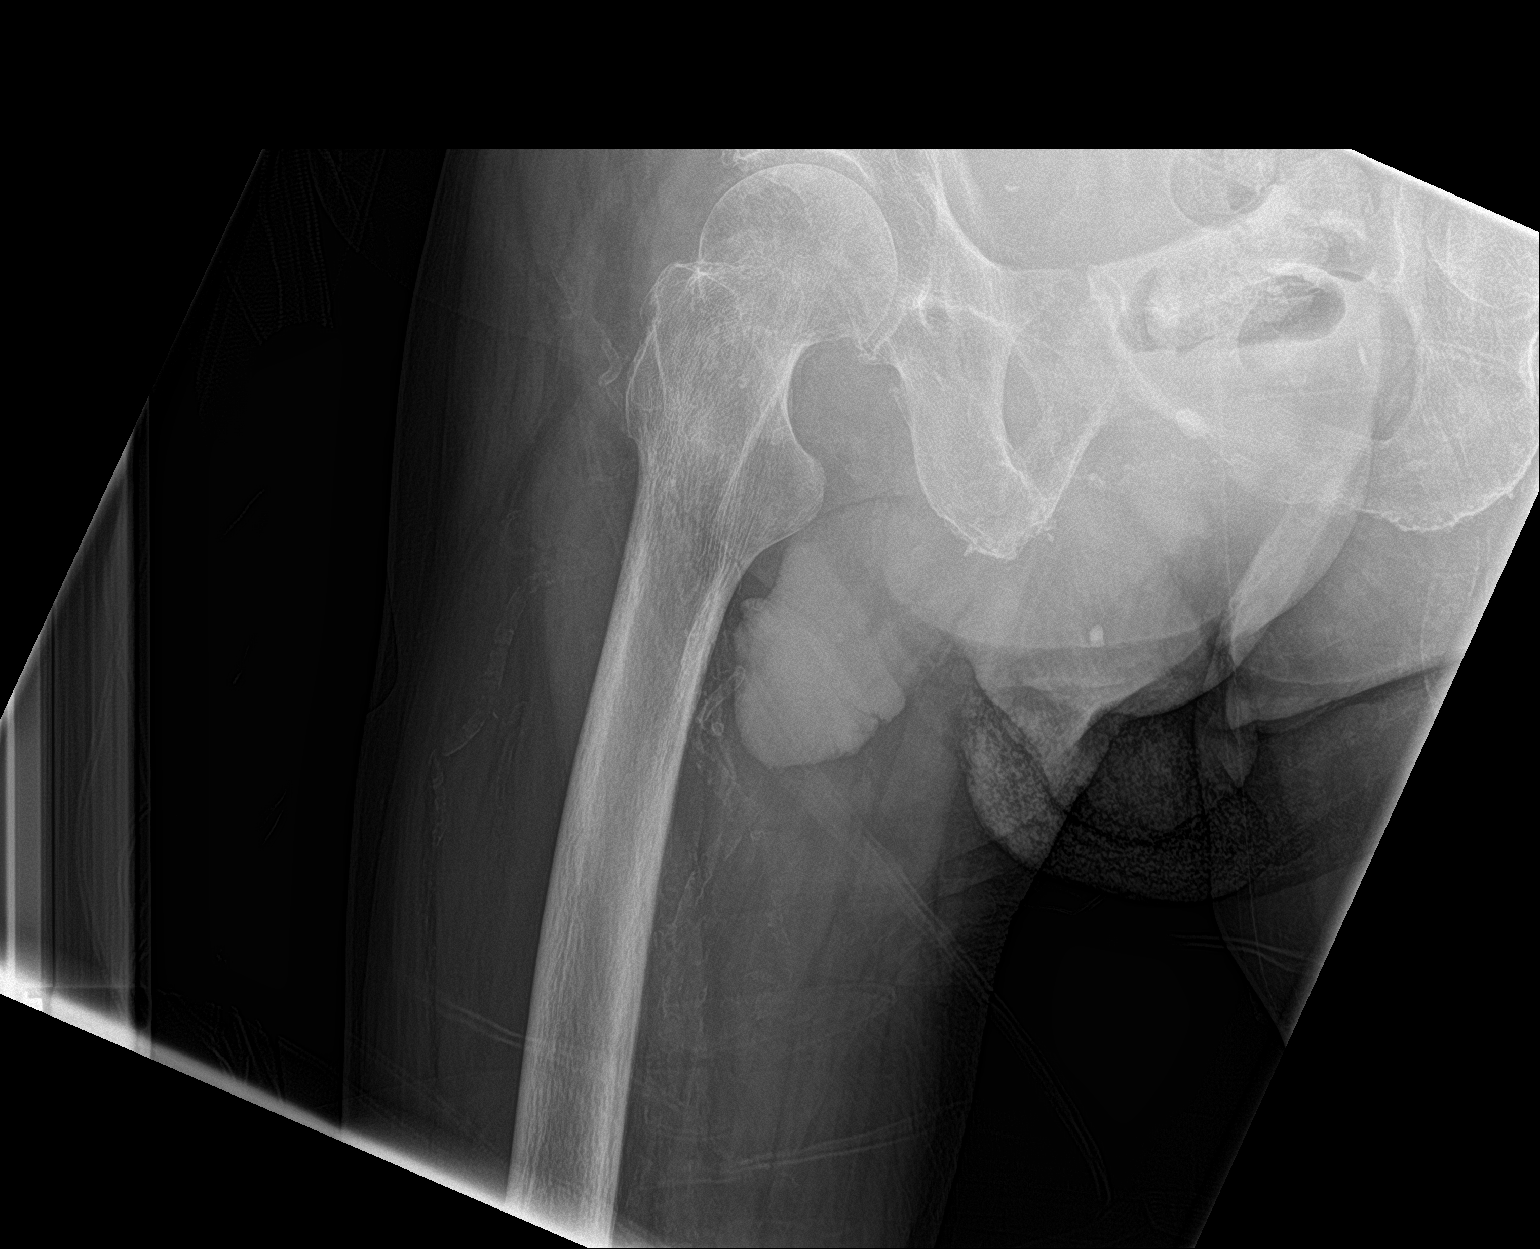

[3 of 3 positions shown; findings below may reference images not displayed]

FINDINGS: There is no evidence of hip fracture or dislocation. There is no
evidence of arthropathy or other focal bone abnormality. Advanced
vascular calcifications are seen within the medial thighs
bilaterally.
IMPRESSION: No acute fracture or dislocation

## 2022-11-07 IMAGING — CT CT CERVICAL SPINE W/O CM
2 series · 12 of 27 positions shown, 15 images · non-contrast
Comparison: 11/25/2020.

CLINICAL DATA: Fall.

EXAM:
CT HEAD WITHOUT CONTRAST
CT CERVICAL SPINE WITHOUT CONTRAST
TECHNIQUE: Multidetector CT imaging of the head and cervical spine was
performed following the standard protocol without intravenous
contrast. Multiplanar CT image reconstructions of the cervical spine
were also generated.

[Series 3: c spine soft · axial · 0.36mm/px · z∈[-239,-95]mm · 7 of 86 slices shown, 9 images]
[im 7/86  soft-tissue]
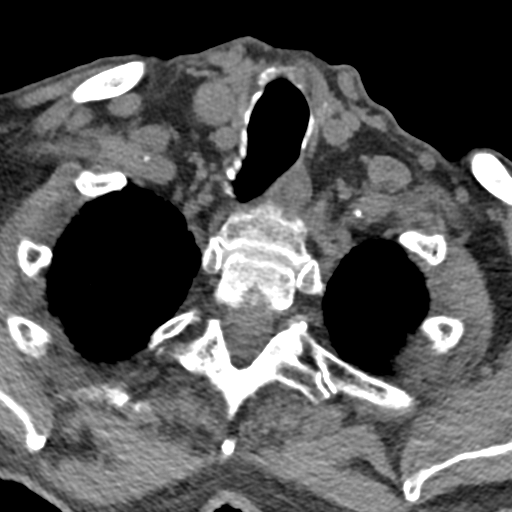
[im 7/86  bone]
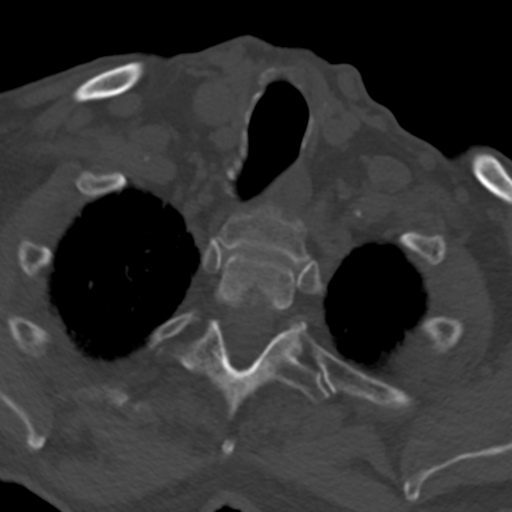
[im 20/86  bone]
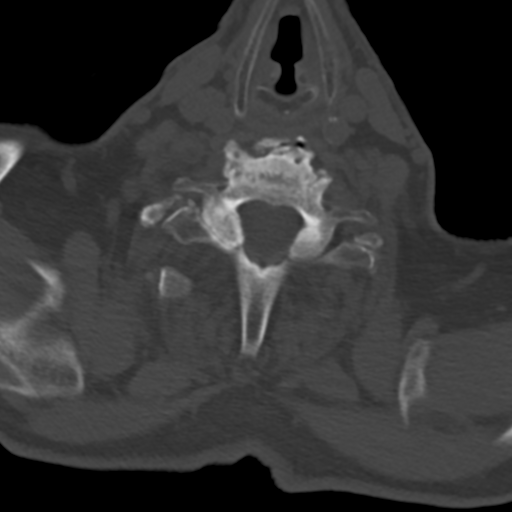
[im 33/86  bone]
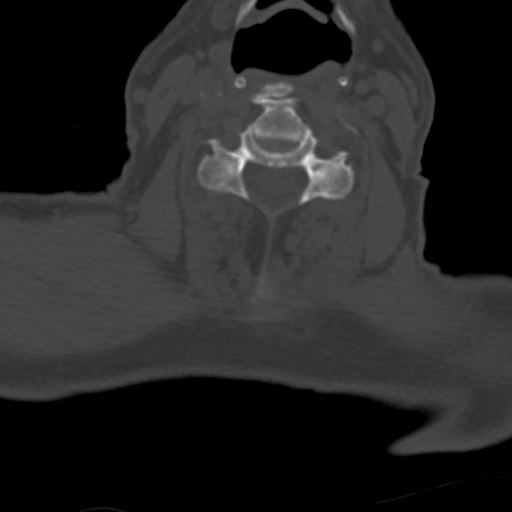
[im 46/86  bone]
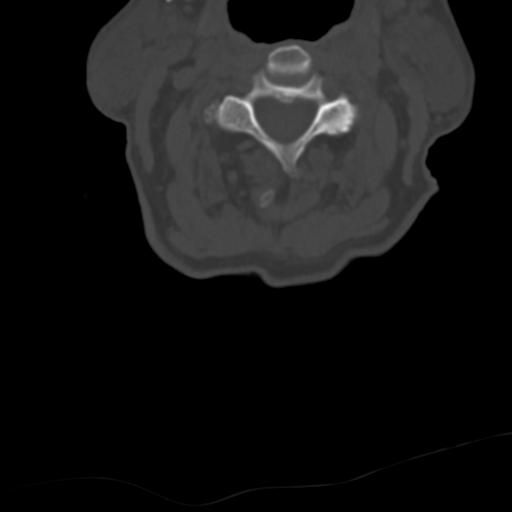
[im 53/86  soft-tissue]
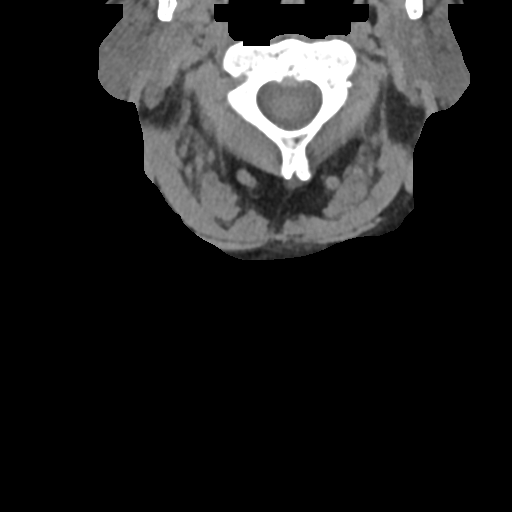
[im 53/86  bone]
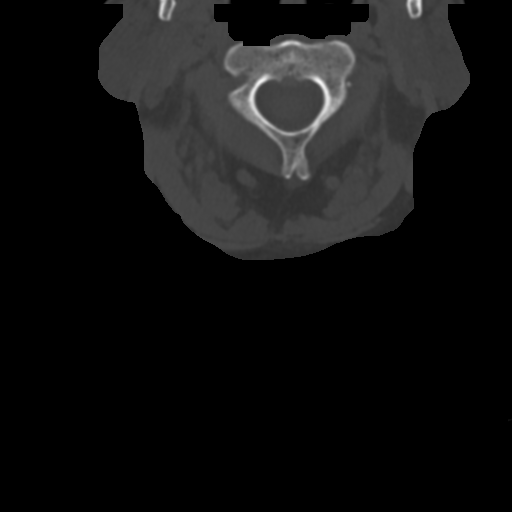
[im 66/86  bone]
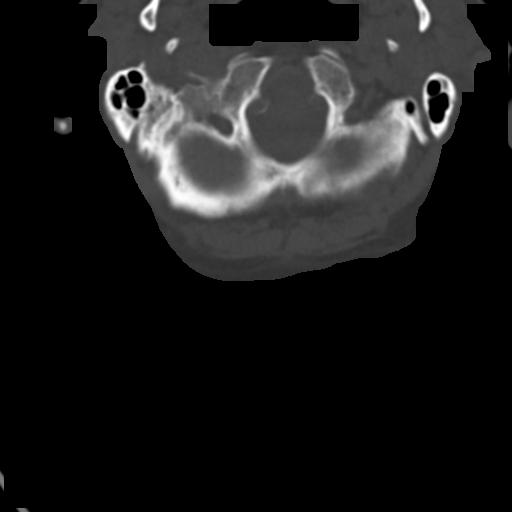
[im 79/86  bone]
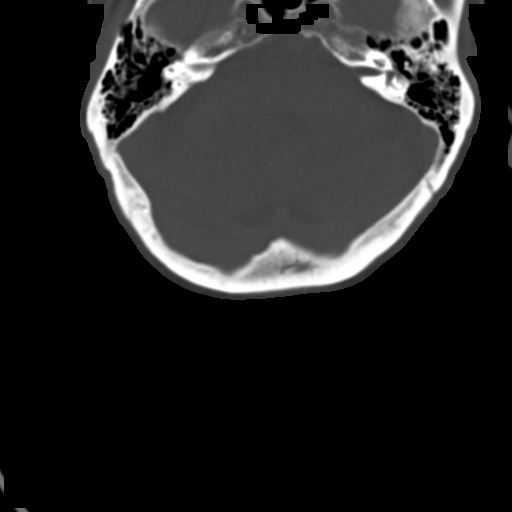

[Series 6: sagittal bone · sagittal · 0.29mm/px · 5 of 57 slices shown, 6 images]
[im 19/57  bone]
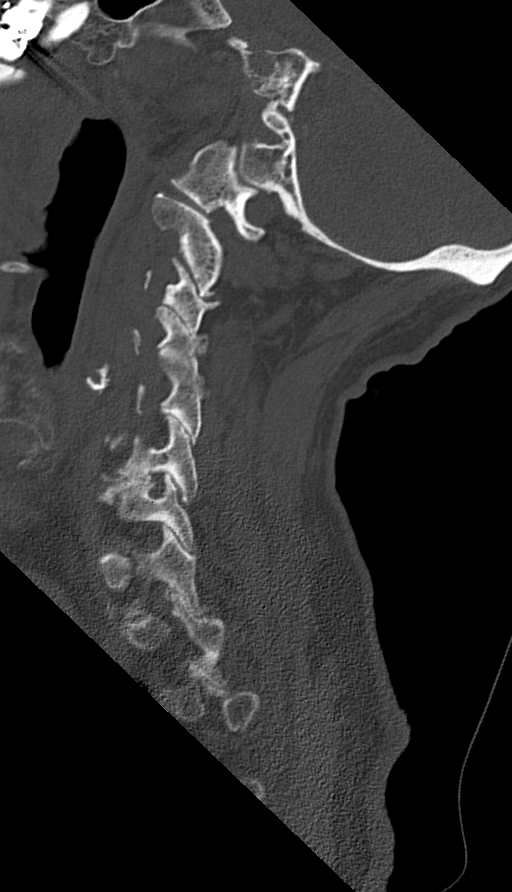
[im 24/57  bone]
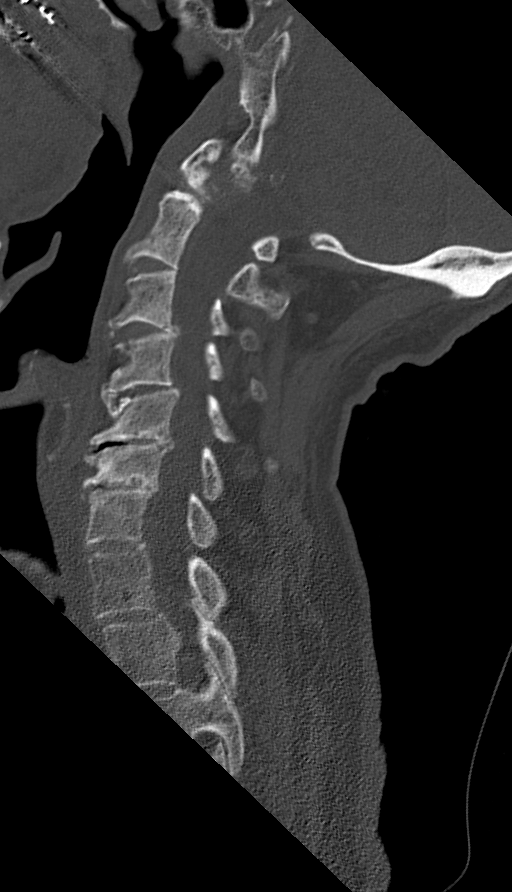
[im 29/57  soft-tissue]
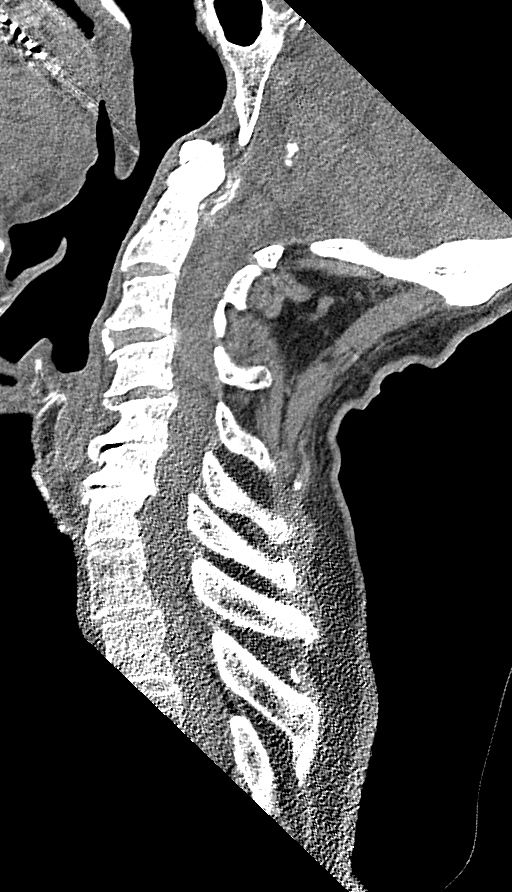
[im 29/57  bone]
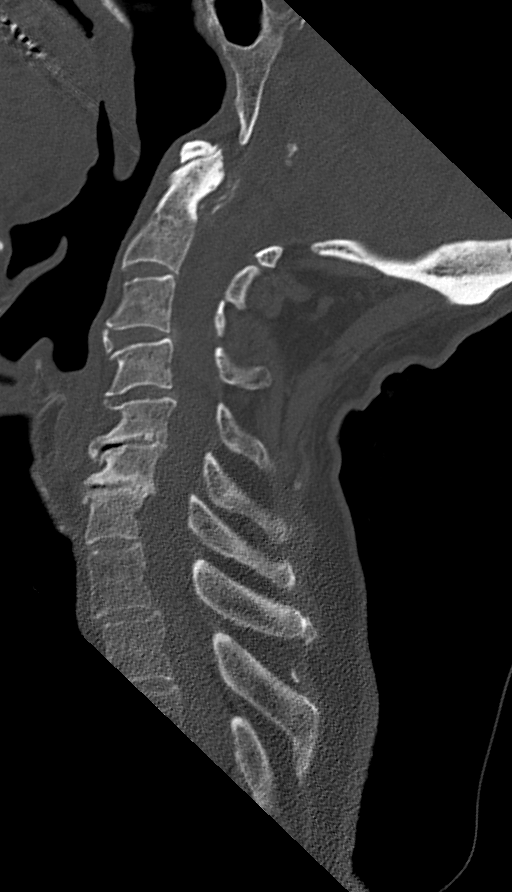
[im 33/57  bone]
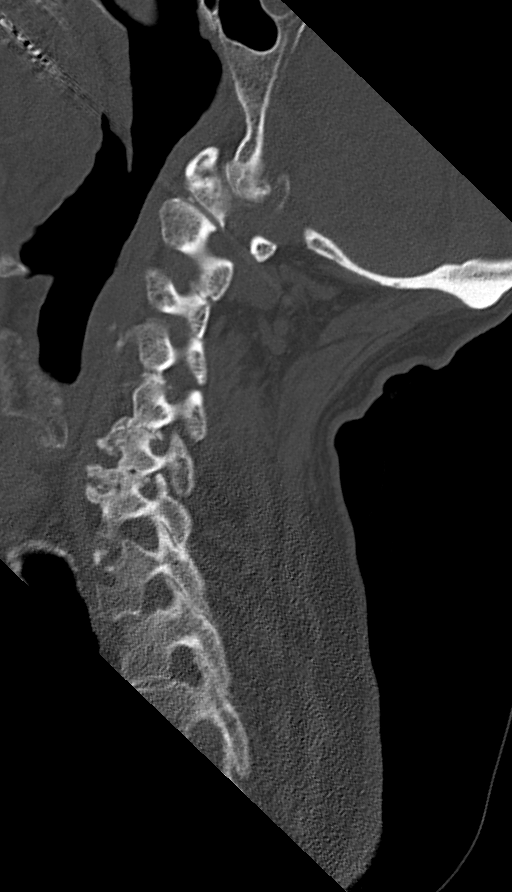
[im 38/57  bone]
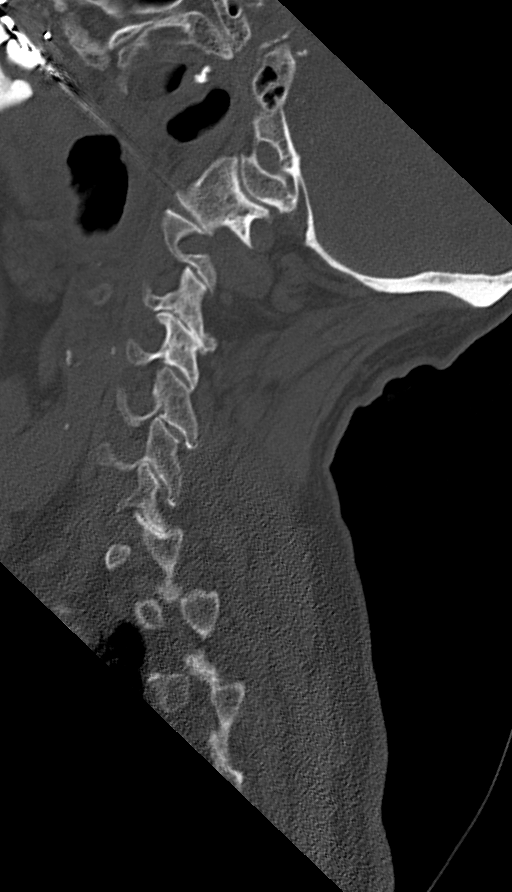

[12 of 27 positions shown; findings below may reference images not displayed]

FINDINGS: CT HEAD FINDINGS

Brain: No evidence of acute infarction, hemorrhage, hydrocephalus,
extra-axial collection or mass lesion/mass effect. Patchy white
matter hypoattenuation, most likely related to chronic microvascular
ischemic disease.

Vascular: Calcific atherosclerosis.

Skull: No acute fracture.

Sinuses/Orbits: No substantial paranasal sinus disease. Remote
deformity of the lateral right maxillary sinus wall and right
lateral orbital wall, unchanged. Remote right zygomatic arch
deformity.

Other: No mastoid effusions.

CT CERVICAL SPINE FINDINGS

Alignment: Similar alignment to prior with lower cervical kyphosis
and slight retrolisthesis of C5 on C6 and C6 on C7. No evidence of
traumatic malalignment.

Skull base and vertebrae: No evidence of acute fracture. Vertebral
body heights are maintained.

Soft tissues and spinal canal: No prevertebral fluid or swelling. No
visible canal hematoma.

Disc levels: Similar multilevel degenerative change, including
severe degenerative disc disease at C6-C7 and moderate degenerative
disease at C5-C6. Associated disc height loss, endplate
sclerosis/irregularity and vacuum disc phenomenon. Posterior disc
osteophyte complexes without evidence of advanced bony canal
stenosis. Osseous fusion across the left C4-C5 facet joint.

Upper chest: Unremarkable.

Other: Calcific atherosclerosis of the carotids.
IMPRESSION: 1. No evidence of acute intracranial abnormality.
2. No acute fracture or traumatic malalignment in the cervical
spine.
3. Similar chronic changes, as detailed above.
